# Patient Record
Sex: Female | Born: 1985 | Race: White | Hispanic: No | Marital: Single | Smoking: Former smoker
Health system: Southern US, Community
[De-identification: ages and names within clinical notes are randomized; demographics above are authoritative.]

## PROBLEM LIST (undated history)

## (undated) DIAGNOSIS — F1921 Other psychoactive substance dependence, in remission: Secondary | ICD-10-CM

## (undated) DIAGNOSIS — F419 Anxiety disorder, unspecified: Secondary | ICD-10-CM

## (undated) DIAGNOSIS — Z87442 Personal history of urinary calculi: Secondary | ICD-10-CM

## (undated) DIAGNOSIS — F1120 Opioid dependence, uncomplicated: Secondary | ICD-10-CM

## (undated) DIAGNOSIS — U071 COVID-19: Secondary | ICD-10-CM

## (undated) HISTORY — DX: Personal history of urinary calculi: Z87.442

## (undated) HISTORY — PX: NO PAST SURGERIES: SHX2092

## (undated) HISTORY — DX: COVID-19: U07.1

## (undated) HISTORY — DX: Other psychoactive substance dependence, in remission: F19.21

## (undated) HISTORY — DX: Anxiety disorder, unspecified: F41.9

## (undated) HISTORY — DX: Opioid dependence, uncomplicated: F11.20

---
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
## 2007-07-29 ENCOUNTER — Ambulatory Visit: Payer: Self-pay | Admitting: Internal Medicine

## 2007-07-29 DIAGNOSIS — J45901 Unspecified asthma with (acute) exacerbation: Secondary | ICD-10-CM

## 2007-07-29 DIAGNOSIS — J309 Allergic rhinitis, unspecified: Secondary | ICD-10-CM | POA: Insufficient documentation

## 2007-07-29 DIAGNOSIS — F411 Generalized anxiety disorder: Secondary | ICD-10-CM | POA: Insufficient documentation

## 2007-07-29 HISTORY — DX: Unspecified asthma with (acute) exacerbation: J45.901

## 2007-08-29 ENCOUNTER — Ambulatory Visit: Payer: Self-pay | Admitting: Internal Medicine

## 2007-10-02 ENCOUNTER — Emergency Department (HOSPITAL_COMMUNITY): Admission: EM | Admit: 2007-10-02 | Discharge: 2007-10-02 | Payer: Self-pay | Admitting: Emergency Medicine

## 2007-10-04 ENCOUNTER — Telehealth: Payer: Self-pay | Admitting: Internal Medicine

## 2008-01-29 ENCOUNTER — Ambulatory Visit: Payer: Self-pay | Admitting: Internal Medicine

## 2008-01-30 ENCOUNTER — Ambulatory Visit: Payer: Self-pay | Admitting: Family Medicine

## 2008-01-30 ENCOUNTER — Telehealth: Payer: Self-pay | Admitting: Internal Medicine

## 2008-02-03 ENCOUNTER — Telehealth: Payer: Self-pay | Admitting: Internal Medicine

## 2008-10-14 ENCOUNTER — Ambulatory Visit: Payer: Self-pay | Admitting: Internal Medicine

## 2008-10-14 DIAGNOSIS — Z87442 Personal history of urinary calculi: Secondary | ICD-10-CM

## 2008-10-14 HISTORY — DX: Personal history of urinary calculi: Z87.442

## 2009-03-30 ENCOUNTER — Ambulatory Visit: Payer: Self-pay | Admitting: Internal Medicine

## 2009-03-30 DIAGNOSIS — J069 Acute upper respiratory infection, unspecified: Secondary | ICD-10-CM | POA: Insufficient documentation

## 2009-04-28 ENCOUNTER — Ambulatory Visit: Payer: Self-pay | Admitting: Family Medicine

## 2009-04-28 DIAGNOSIS — J029 Acute pharyngitis, unspecified: Secondary | ICD-10-CM | POA: Insufficient documentation

## 2009-04-28 DIAGNOSIS — J209 Acute bronchitis, unspecified: Secondary | ICD-10-CM

## 2010-09-16 NOTE — Discharge Summary (Signed)
Brandy Zuniga, Brandy Zuniga NO.:  [id number]   MEDICAL RECORD NO.:  [id number]          PATIENT TYPE:  INP   LOCATION:  6126                         FACILITY:  [facility]   PHYSICIAN:  Sylvan Cheese, M.D.       DATE OF BIRTH:  25-Feb-1986   DATE OF ADMISSION:  06/01/2006  DATE OF DISCHARGE:  06/03/2006                               DISCHARGE SUMMARY   DISCHARGE DIAGNOSES:  1. Acute asthma exacerbation.  2. Small right apical pneumothorax and pneumomediastinum.   DISCHARGE MEDICATIONS:  1. Albuterol 2.5 mg nebulizers every six hours as needed for wheezing.  2. Prednisone 60 mg p.o. daily for three days.   HOSPITAL COURSE:  [name] is an 25 year old white female who presented to  her primary physician on the day of admission with a one-day history of  wheezing that had been responsive to one day of albuterol nebulizers and  prednisone 60 mg p.o.  She received two back-to-back albuterol  treatments in her primary physician's office and a third nebulizer  treatment via EMS en route to the hospital.  Upon arrival to the  emergency department, her oxygen saturation was 79% on room air.  It  improved to 100% on nonrebreather mask.  She completed a one hour  continuous albuterol and Atrovent nebulizer and also received Solu-  Medrol 125 mg IV in the emergency department.  She showed significant  improvement in her work of breathing and tachypnea.  The patient had a  rapid strep test that was negative on admission.  She was started on  albuterol nebulizers every four hours with every two hour as needed  treatment.  These nebulizers were spaced every six hours at the time of  discharge.  She was also initiated on oxygen therapy to maintain O2  saturations greater than 93%.  She was tolerating room air for more than  24 hours at the time of discharge.  Of note the patient did have a chest  x-ray on admission that showed a small right apical pneumothorax with  subcutaneous air in the  right axilla and chest wall.  A repeat chest x-  ray on February 2 showed worsening subcutaneous air.  A final chest x-  ray on February 3 showed stable pneumomediastinum with some improvement.  There was also questionable left lower lobe atelectasis.  She reports  much improved breathing status and a cough that is almost resolved.   FOLLOWUP INSTRUCTIONS:  The patient has been instructed to follow up  with her primary physician at [facility].  She was instructed  to call to make an appointment this week.           ______________________________  Sylvan Cheese, M.D.     MJ/MEDQ  D:  06/03/2006  T:  06/03/2006  Job:  [id number]

## 2011-01-26 LAB — URINALYSIS, ROUTINE W REFLEX MICROSCOPIC
Ketones, ur: NEGATIVE
Protein, ur: 30 — AB
Urobilinogen, UA: 0.2
pH: 6

## 2011-01-26 LAB — URINE MICROSCOPIC-ADD ON

## 2011-01-26 LAB — CBC
HCT: 43.4
MCHC: 34.5
RBC: 4.66
RDW: 13.5
WBC: 9.1

## 2011-01-26 LAB — URINE CULTURE: Colony Count: 7000

## 2011-01-26 LAB — POCT I-STAT, CHEM 8
Chloride: 105
Glucose, Bld: 92
HCT: 45
Potassium: 3.5

## 2011-01-26 LAB — DIFFERENTIAL
Eosinophils Relative: 4
Lymphocytes Relative: 31
Monocytes Absolute: 0.8

## 2011-02-01 ENCOUNTER — Emergency Department (HOSPITAL_COMMUNITY): Payer: Worker's Compensation

## 2011-02-01 ENCOUNTER — Inpatient Hospital Stay (HOSPITAL_COMMUNITY)
Admission: EM | Admit: 2011-02-01 | Discharge: 2011-02-04 | DRG: 505 | Disposition: A | Discharge status: Home / Self Care | Payer: Worker's Compensation | Source: Ambulatory Visit | Attending: General Surgery | Admitting: General Surgery

## 2011-02-01 DIAGNOSIS — IMO0002 Reserved for concepts with insufficient information to code with codable children: Secondary | ICD-10-CM

## 2011-02-01 DIAGNOSIS — F17200 Nicotine dependence, unspecified, uncomplicated: Secondary | ICD-10-CM | POA: Diagnosis present

## 2011-02-01 DIAGNOSIS — Y992 Volunteer activity: Secondary | ICD-10-CM

## 2011-02-01 DIAGNOSIS — S62309A Unspecified fracture of unspecified metacarpal bone, initial encounter for closed fracture: Secondary | ICD-10-CM | POA: Diagnosis present

## 2011-02-01 DIAGNOSIS — S92109A Unspecified fracture of unspecified talus, initial encounter for closed fracture: Secondary | ICD-10-CM

## 2011-02-01 DIAGNOSIS — Y92410 Unspecified street and highway as the place of occurrence of the external cause: Secondary | ICD-10-CM

## 2011-02-01 DIAGNOSIS — F3289 Other specified depressive episodes: Secondary | ICD-10-CM | POA: Diagnosis present

## 2011-02-01 DIAGNOSIS — J45909 Unspecified asthma, uncomplicated: Secondary | ICD-10-CM | POA: Diagnosis present

## 2011-02-01 DIAGNOSIS — F329 Major depressive disorder, single episode, unspecified: Secondary | ICD-10-CM | POA: Diagnosis present

## 2011-02-01 DIAGNOSIS — F1120 Opioid dependence, uncomplicated: Secondary | ICD-10-CM | POA: Diagnosis present

## 2011-02-01 LAB — COMPREHENSIVE METABOLIC PANEL
ALT: 16 U/L (ref 0–35)
AST: 26 U/L (ref 0–37)
Alkaline Phosphatase: 66 U/L (ref 39–117)
CO2: 23 mEq/L (ref 19–32)
Chloride: 105 mEq/L (ref 96–112)
GFR calc non Af Amer: >90 mL/min (ref >90)
Sodium: 137 mEq/L (ref 135–145)
Total Bilirubin: 0.4 mg/dL (ref 0.3–1.2)

## 2011-02-01 LAB — POCT I-STAT, CHEM 8
Calcium, Ion: 1.09 mmol/L — ABNORMAL LOW (ref 1.12–1.32)
Creatinine, Ser: 0.7 mg/dL (ref 0.50–1.10)
Glucose, Bld: 109 mg/dL — ABNORMAL HIGH (ref 70–99)
HCT: 38 % (ref 36.0–46.0)
Hemoglobin: 12.9 g/dL (ref 12.0–15.0)
Potassium: 3.2 meq/L — ABNORMAL LOW (ref 3.5–5.1)
TCO2: 21 mmol/L (ref 0–100)

## 2011-02-01 LAB — CBC
HCT: 34.9 % — ABNORMAL LOW (ref 36.0–46.0)
RBC: 3.97 MIL/uL (ref 3.87–5.11)
RDW: 12.7 % (ref 11.5–15.5)
WBC: 12.1 10*3/uL — ABNORMAL HIGH (ref 4.0–10.5)

## 2011-02-01 LAB — PROTIME-INR
INR: 1.12 (ref 0.00–1.49)
Prothrombin Time: 14.6 seconds (ref 11.6–15.2)

## 2011-02-01 LAB — TYPE AND SCREEN
ABO/RH(D): B POS
Antibody Screen: NEGATIVE

## 2011-02-01 LAB — URINALYSIS, ROUTINE W REFLEX MICROSCOPIC
Bilirubin Urine: NEGATIVE
Hgb urine dipstick: NEGATIVE
Ketones, ur: NEGATIVE mg/dL
Protein, ur: NEGATIVE mg/dL
Specific Gravity, Urine: 1.008 (ref 1.005–1.030)
Urobilinogen, UA: 0.2 mg/dL (ref 0.0–1.0)

## 2011-02-01 LAB — LACTIC ACID, PLASMA: Lactic Acid, Venous: 1.6 mmol/L (ref 0.5–2.2)

## 2011-02-02 ENCOUNTER — Inpatient Hospital Stay (HOSPITAL_COMMUNITY): Payer: Worker's Compensation

## 2011-02-02 LAB — BASIC METABOLIC PANEL
BUN: 5 mg/dL — ABNORMAL LOW (ref 6–23)
Chloride: 104 mEq/L (ref 96–112)
Creatinine, Ser: 0.61 mg/dL (ref 0.50–1.10)
GFR calc Af Amer: >90 mL/min (ref >90)

## 2011-02-02 LAB — CBC
HCT: 36.9 % (ref 36.0–46.0)
MCV: 88.9 fL (ref 78.0–100.0)
RDW: 12.9 % (ref 11.5–15.5)
WBC: 12.8 10*3/uL — ABNORMAL HIGH (ref 4.0–10.5)

## 2011-02-13 ENCOUNTER — Encounter (HOSPITAL_COMMUNITY)
Admission: RE | Admit: 2011-02-13 | Discharge: 2011-02-13 | Disposition: A | Discharge status: Home / Self Care | Payer: Worker's Compensation | Source: Ambulatory Visit | Attending: Orthopedic Surgery | Admitting: Orthopedic Surgery

## 2011-02-13 LAB — SURGICAL PCR SCREEN: MRSA, PCR: NEGATIVE

## 2011-02-13 LAB — CBC
HCT: 37.7 % (ref 36.0–46.0)
MCHC: 35.5 g/dL (ref 30.0–36.0)
MCV: 87.9 fL (ref 78.0–100.0)
RDW: 12.3 % (ref 11.5–15.5)

## 2011-02-14 ENCOUNTER — Inpatient Hospital Stay (HOSPITAL_COMMUNITY)
Admission: RE | Admit: 2011-02-14 | Discharge: 2011-02-15 | DRG: 497 | Disposition: A | Discharge status: Home / Self Care | Payer: Worker's Compensation | Source: Ambulatory Visit | Attending: Orthopedic Surgery | Admitting: Orthopedic Surgery

## 2011-02-14 ENCOUNTER — Inpatient Hospital Stay (HOSPITAL_COMMUNITY): Payer: Worker's Compensation

## 2011-02-14 DIAGNOSIS — S92109A Unspecified fracture of unspecified talus, initial encounter for closed fracture: Principal | ICD-10-CM | POA: Diagnosis present

## 2011-02-14 DIAGNOSIS — L089 Local infection of the skin and subcutaneous tissue, unspecified: Secondary | ICD-10-CM | POA: Diagnosis present

## 2011-02-14 DIAGNOSIS — J45909 Unspecified asthma, uncomplicated: Secondary | ICD-10-CM | POA: Diagnosis present

## 2011-02-14 DIAGNOSIS — Z01812 Encounter for preprocedural laboratory examination: Secondary | ICD-10-CM

## 2011-02-14 DIAGNOSIS — Z87828 Personal history of other (healed) physical injury and trauma: Secondary | ICD-10-CM

## 2011-02-15 LAB — CBC
HCT: 34.4 % — ABNORMAL LOW (ref 36.0–46.0)
Hemoglobin: 12.1 g/dL (ref 12.0–15.0)
MCH: 30.9 pg (ref 26.0–34.0)
MCHC: 35.2 g/dL (ref 30.0–36.0)
MCV: 88 fL (ref 78.0–100.0)

## 2011-02-15 LAB — BASIC METABOLIC PANEL
CO2: 25 mEq/L (ref 19–32)
Calcium: 9.5 mg/dL (ref 8.4–10.5)
Chloride: 101 mEq/L (ref 96–112)
Creatinine, Ser: 0.47 mg/dL — ABNORMAL LOW (ref 0.50–1.10)
GFR calc Af Amer: >90 mL/min (ref >90)
Sodium: 137 mEq/L (ref 135–145)
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
## 2011-02-23 NOTE — Op Note (Signed)
NAMEPERLE, BRICKHOUSE NO.:  [id number]  MEDICAL RECORD NO.:  [id number]  LOCATION:  5011                         FACILITY:  [facility]  PHYSICIAN:  Doralee Albino. Carola Frost, M.D. DATE OF BIRTH:  11-Oct-1985  DATE OF PROCEDURE:  02/14/2011 DATE OF DISCHARGE:                              OPERATIVE REPORT   PREOPERATIVE DIAGNOSES: 1. Right talar neck and body fractures. 2. Retained external fixator. 3. External fixator pin site infection proximal tibia.  POSTOPERATIVE DIAGNOSES: 1. Right talar neck and body fractures. 2. Retained external fixator. 3. External fixator pin site infection proximal tibia.  PROCEDURES: 1. Open reduction and internal fixation of right talus. 2. Removal of external fixator under anesthesia. 3. Incision and drainage of the pin sites proximal tibia with     curettage.  SURGEON:  Doralee Albino. Carola Frost, MD  ASSISTANT:  Mearl Latin, PA  ANESTHESIA:  General supplemented with popliteal block.  COMPLICATIONS:  None.  ESTIMATED BLOOD LOSS:  Less than 50 mL.  TOTAL TOURNIQUET TIME:  Approximately 100 minutes.  DISPOSITION:  To PACU.  CONDITION:  Stable.  BRIEF SUMMARY AND INDICATIONS OF PROCEDURE:  Brandy Zuniga is a 25 year old female status post fracture dislocation of the right talus.  This was treated provisionally with spanning external fixation, partial reduction, and soft tissue measures to control and reduce swelling. Once the soft tissues were adequately resolved, she was then able to return to the OR for definitive internal fixation.  I discussed with her the risks and benefits of surgery.  A discussion which was also had with her parents.  These included avascular necrosis, arthritis, malunion, nonunion, symptomatic hardware, need for further surgery, decreased motion, DVT, PE, heart attack, stroke, and multiple others and the patient did wish to proceed.  BRIEF SUMMARY OF PROCEDURE:  Brandy Zuniga was administered  preoperative antibiotics as well as a popliteal block and taken to the operating room where general anesthesia was induced.  Her right lower extremity was prepped and draped in usual sterile fashion.  Tourniquet was placed about the thigh.  The external fixator was then removed.  The fixator pin sites scrubbed thoroughly with chlorhexidine.  I did remove them from the medial side given the proximal medial tibia was the most painful for the patient and she had some surrounding erythema and drainage with tenderness that had continued to expand into her pes bursa.  A curette was then used to thoroughly clean the bone canal and to excise the bone tract, as well as to work within the soft tissues and bursal area medially and laterally.  Following this, a sterile irrigation was performed and then the pin sites were isolated from the area with Ioban and lap sponges.  The drape was removed and fresh drapes used for the remainder of the case. Anterolateral approach was then made to the talar neck elevating the extensor digitorum brevis and elevating the tourniquet after exposure first exsanguinating the limb with an Esmarch bandage.  The talar neck fracture was developed and the joint evaluated.  There was considerable comminution to the bone adjacent to the lateral talar process.  There was also a large fragment in the posterior aspect of  the joint, which could not be retrieved through the fracture site.  Ultimately, this fragment had to be approached through a separate posterolateral arthrotomy which increased the difficulty of the procedure considerably and added onto the operative time substantially as well by at least 25 minutes.  The fracture fragments were then reduced placing an instrument posterior to the talus and bring it forward into a reduced position along the lateral gutter inside placing a K-wire into the talar head so it could be used as a joystick for reduction and achieving  provisional reduction with K-wires, then, there was again multiple comminuted fragments that were osteochondral that were debrided from the joint, part of the subtalar joint was not reconstructible.  The portion of the posterior facet taken out of the back of the joint had extensively damaged cartilage on it and it was nearly a 2 x 1 cm but had no substantial bone behind this large osteochondral fragments such that it could be fixed.  The defect was grafted with flexor graft and the graft was tamped away from the articular surface to create more room for any sort of injury to the receiving articular side.  The graft was placed after first applying the small 2.0 blade plate along the lateral aspect of the talar head, neck and body.  This resulted in excellent reduction stabilization of the fracture.  It was checked on multiple C-arm images including AP, oblique, and lateral views to ensure appropriate reduction hardware placement.  There remained some loss of articular surface posteriorly that appeared to be somewhat matching on both the calcaneal and talar side.  The wound was copiously irrigated.  The tourniquet deflated.  No significant bleeding was encountered, and then the wound was closed with 3-0 nylon and for the deep layer 3-0 Vicryl.  Sterile gently compressive dressing and a posterior stirrup splint were then applied.  The patient awakened from anesthesia and transported to PACU in stable condition.  Montez Morita, PA-C assisted me throughout the procedure and was absolutely necessary for safe and effective completion of the case particularly for exposure of the posterolateral capsule, control and maintenance of reduction during provisional fixation as well as removal of provisional fixation during definitive and assistance with wound closure among others.  PROGNOSIS:  Brandy Zuniga has had a tremendous and severe injury to her right ankle and is at elevated risk for avascular  necrosis, nonunion, subtalar arthritis given the intraoperative findings as well as the nature of the injury and therefore the potential need for subsequent surgery.  She will be nonweightbearing with unrestricted range of motion after her followup visit, and we anticipate 6-8 weeks before initiating graduated weightbearing.  The debridement of the pin site should result in resolution of her symptoms, and she will receive antibiotics accordingly.     Doralee Albino. Carola Frost, M.D.     MHH/MEDQ  D:  02/14/2011  T:  02/15/2011  Job:  [id number]  Electronically Signed by Myrene Galas M.D. on 02/23/2011 05:20:08 PM

## 2011-05-18 ENCOUNTER — Encounter: Payer: Self-pay | Admitting: Internal Medicine

## 2011-05-18 ENCOUNTER — Ambulatory Visit (INDEPENDENT_AMBULATORY_CARE_PROVIDER_SITE_OTHER): Payer: Managed Care, Other (non HMO) | Admitting: Family Medicine

## 2011-05-18 ENCOUNTER — Encounter: Payer: Worker's Compensation | Admitting: Internal Medicine

## 2011-05-18 ENCOUNTER — Encounter: Payer: Self-pay | Admitting: Family Medicine

## 2011-05-18 VITALS — BP 104/70 | Temp 98.4°F | Ht 64.0 in | Wt 131.0 lb

## 2011-05-18 DIAGNOSIS — J069 Acute upper respiratory infection, unspecified: Secondary | ICD-10-CM

## 2011-05-18 DIAGNOSIS — J029 Acute pharyngitis, unspecified: Secondary | ICD-10-CM

## 2011-05-18 LAB — POCT RAPID STREP A (OFFICE): Rapid Strep A Screen: NEGATIVE

## 2011-05-18 MED ORDER — HYDROCODONE-HOMATROPINE 5-1.5 MG/5ML PO SYRP: ORAL_SOLUTION | ORAL | Status: DC

## 2011-05-18 NOTE — Progress Notes (Signed)
  Subjective:    Patient ID: Brandy Zuniga, female    DOB: 1985-09-30, 26 y.o.   MRN: [id number]  HPI Brandy Zuniga Is a 26 year old single female, who comes in with a 3-day history of sore throat, head congestion, and mild cough.  Temp 102.  Last night, normal today.  Rapid strep negative.  She smokes a half a pack of cigarettes a day .  She states she stopped before using the cold [place] method.  Her boyfriend, who accompanies her.  Would like to also stop smoking.  I advised them to consult with Dr. Kirtland Zuniga  and consider the chantix program   Review of Systems    General in ENT review of systems otherwise negative Objective:   Physical Exam Well-developed well-nourished, female, in no acute distress.  HEENT negativeExcept for swollen tonsils rapid strep negative.  Neck was supple.  No adenopathy.  Lungs are clear       Assessment & Plan:  Viral syndrome.  Plan treat symptomatically, liquids, Hydromet p.r.n.  Tobacco abuse, stopped now

## 2011-05-18 NOTE — Patient Instructions (Signed)
Drink lots of liquids.  Hydromet one half to 1 teaspoon up to 3 times a day as needed for sore throat.  Stop smoking.  Return p.r.n.

## 2011-06-29 NOTE — Progress Notes (Signed)
This encounter was created in error - please disregard.

## 2011-09-27 ENCOUNTER — Other Ambulatory Visit: Payer: Self-pay | Admitting: Orthopedic Surgery

## 2011-09-27 DIAGNOSIS — M1990 Unspecified osteoarthritis, unspecified site: Secondary | ICD-10-CM

## 2011-10-02 ENCOUNTER — Ambulatory Visit
Admission: RE | Admit: 2011-10-02 | Discharge: 2011-10-02 | Disposition: A | Discharge status: Home / Self Care | Payer: Worker's Compensation | Source: Ambulatory Visit | Attending: Orthopedic Surgery | Admitting: Orthopedic Surgery

## 2011-10-02 DIAGNOSIS — M1990 Unspecified osteoarthritis, unspecified site: Secondary | ICD-10-CM

## 2011-10-02 MED ORDER — METHYLPREDNISOLONE ACETATE 40 MG/ML INJ SUSP (RADIOLOG
120.0000 mg | Freq: Once | INTRAMUSCULAR | Status: AC
  Administered 2011-10-02: 120 mg via INTRA_ARTICULAR

## 2011-10-02 MED ORDER — IOHEXOL 180 MG/ML  SOLN
1.0000 mL | Freq: Once | INTRAMUSCULAR | Status: AC | PRN
  Administered 2011-10-02: 1 mL via INTRA_ARTICULAR

## 2012-01-18 ENCOUNTER — Other Ambulatory Visit: Payer: Self-pay | Admitting: Orthopedic Surgery

## 2012-01-18 DIAGNOSIS — M1990 Unspecified osteoarthritis, unspecified site: Secondary | ICD-10-CM

## 2012-01-22 ENCOUNTER — Ambulatory Visit
Admission: RE | Admit: 2012-01-22 | Discharge: 2012-01-22 | Disposition: A | Discharge status: Home / Self Care | Payer: Worker's Compensation | Source: Ambulatory Visit | Attending: Orthopedic Surgery | Admitting: Orthopedic Surgery

## 2012-01-22 DIAGNOSIS — M1990 Unspecified osteoarthritis, unspecified site: Secondary | ICD-10-CM

## 2012-01-22 MED ORDER — METHYLPREDNISOLONE ACETATE 40 MG/ML INJ SUSP (RADIOLOG
120.0000 mg | Freq: Once | INTRAMUSCULAR | Status: AC
  Administered 2012-01-22: 120 mg via INTRA_ARTICULAR

## 2012-01-22 MED ORDER — IOHEXOL 180 MG/ML  SOLN
1.0000 mL | Freq: Once | INTRAMUSCULAR | Status: AC | PRN
  Administered 2012-01-22: 1 mL via INTRA_ARTICULAR

## 2013-01-09 IMAGING — CT CT ANKLE*R* W/O CM
3 series · 15 of 33 positions shown, 18 images · non-contrast
Comparison: CT 02/01/2011 and radiographs 02/02/2011.

CLINICAL DATA: Follow-up right talar fracture status post external
fixation.

CT OF THE RIGHT ANKLE WITHOUT CONTRAST
TECHNIQUE: Multidetector CT imaging of the right ankle and right
foot was performed according to the standard protocol without
intravenous contrast. Multiplanar CT image reconstructions were
also generated.
TECHNIQUE: 3-dimensional CT images were rendered by post-
processing of the original CT data at the CT scanner.  The 3-
dimensional CT images were interpreted, and findings were reported
in the accompanying complete CT report for this study.

[Series 6: lowextremity 2.0 b20s · axial · 0.54mm/px · z∈[-1122,-900]mm · 7 of 133 slices shown, 9 images]
[im 11/133  soft-tissue]
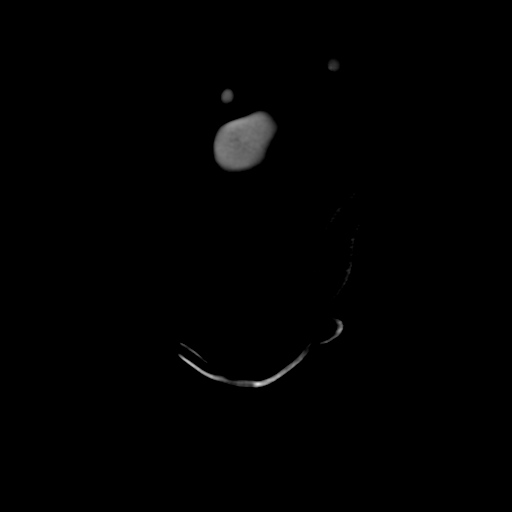
[im 11/133  bone]
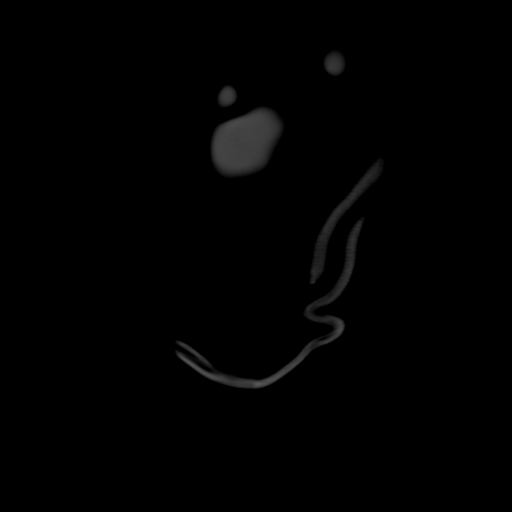
[im 31/133  bone]
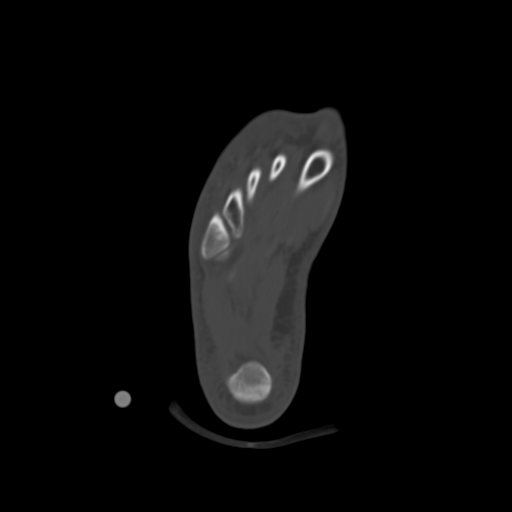
[im 51/133  bone]
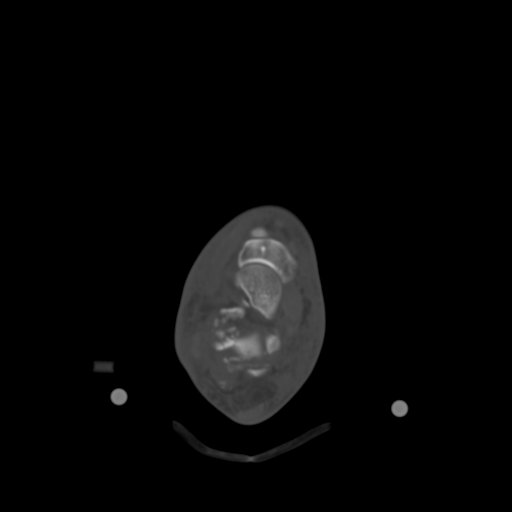
[im 72/133  bone]
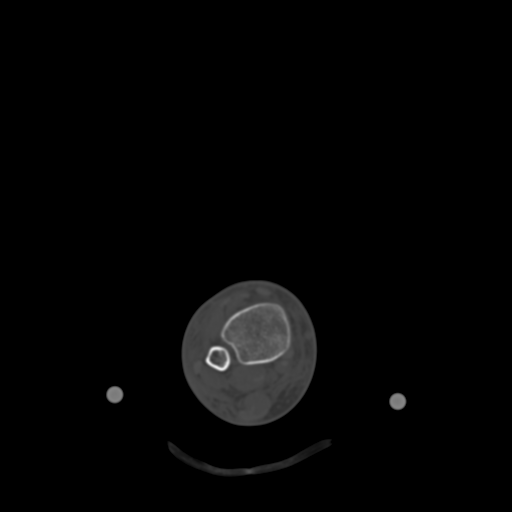
[im 82/133  soft-tissue]
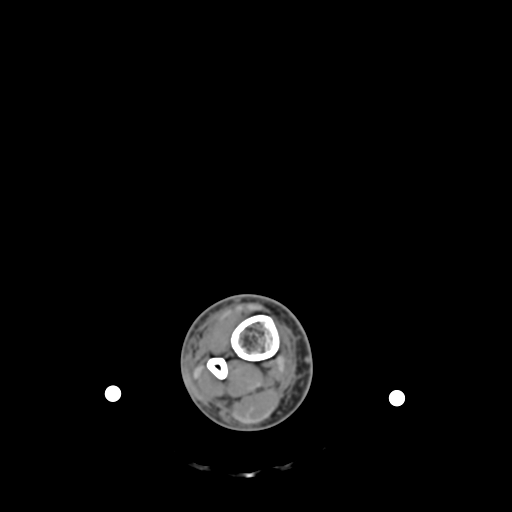
[im 82/133  bone]
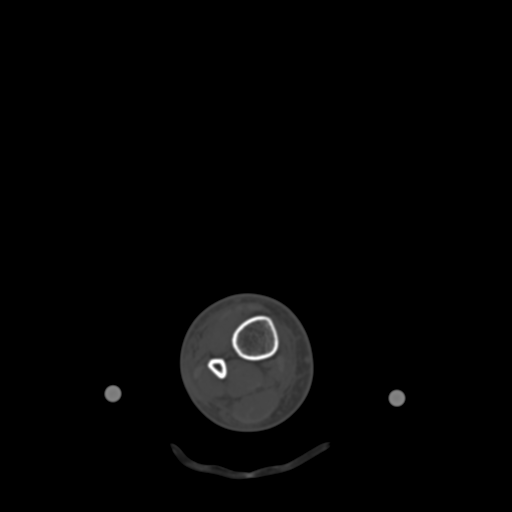
[im 102/133  bone]
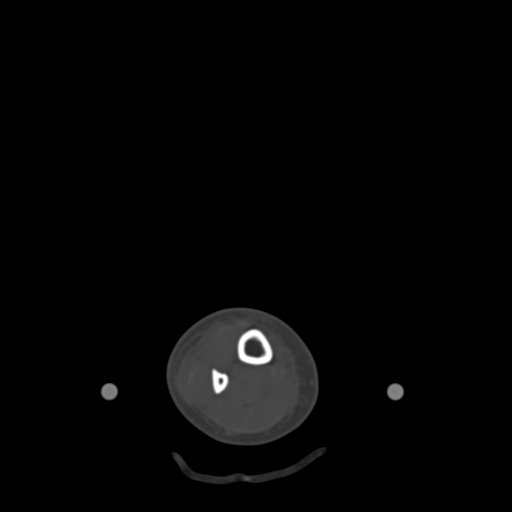
[im 122/133  bone]
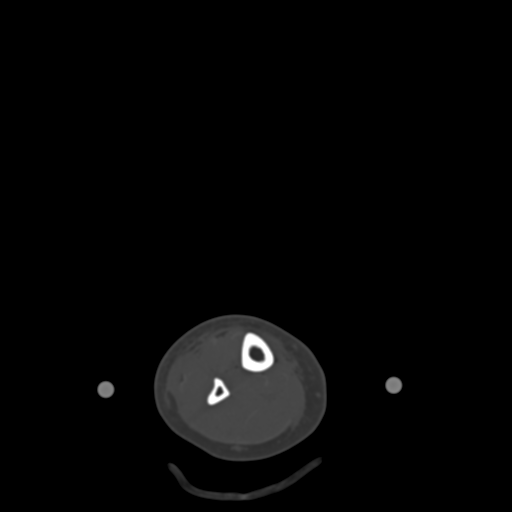

[Series 602: sagittal · sagittal · 0.54mm/px · 5 of 50 slices shown, 6 images]
[im 13/50  bone]
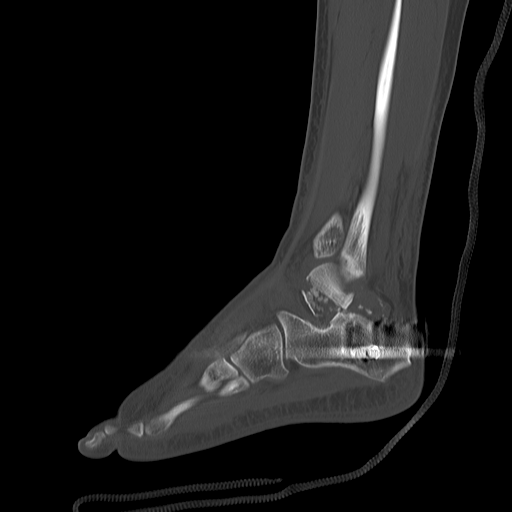
[im 17/50  bone]
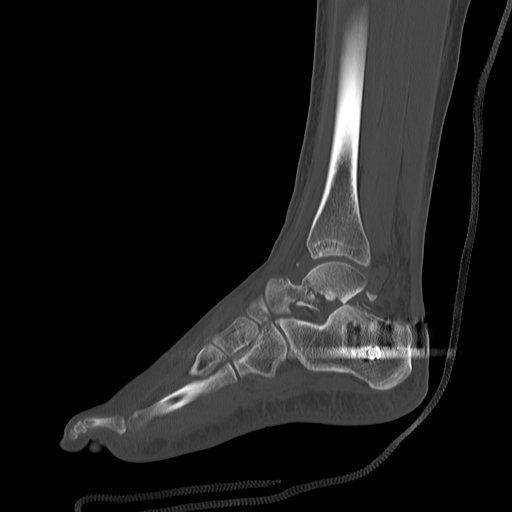
[im 21/50  bone]
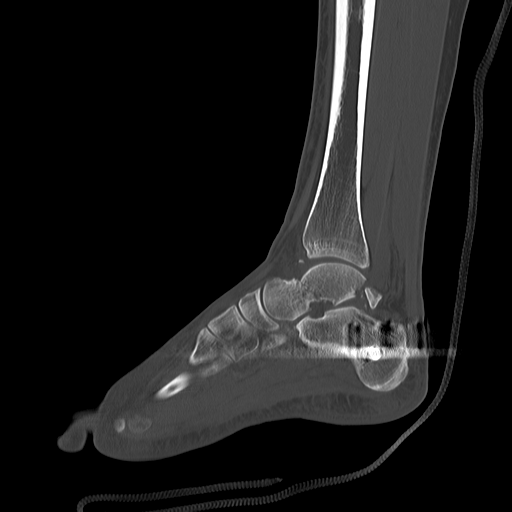
[im 25/50  soft-tissue]
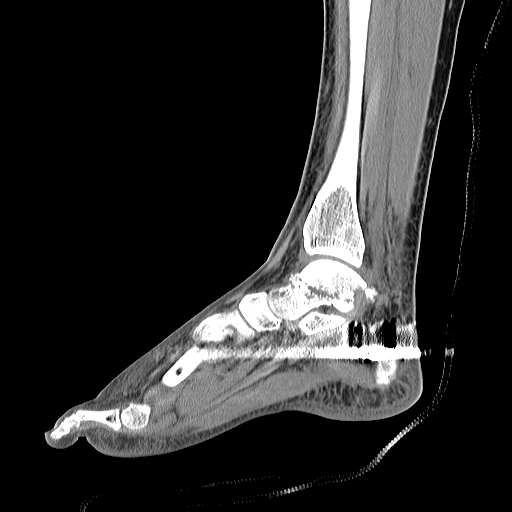
[im 25/50  bone]
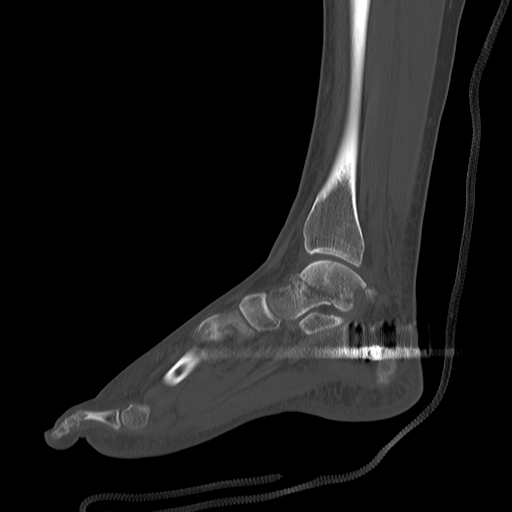
[im 29/50  bone]
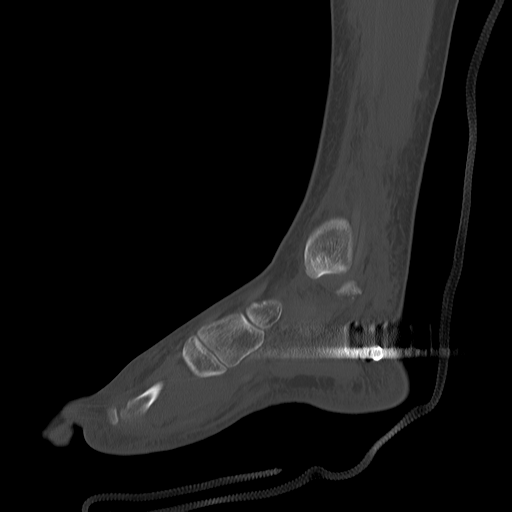

[Series 603: coronal · coronal · 0.54mm/px · 3 of 103 slices shown]
[im 21/103  bone]
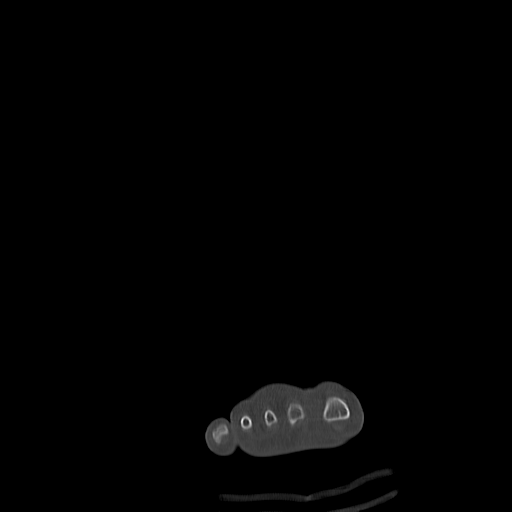
[im 41/103  bone]
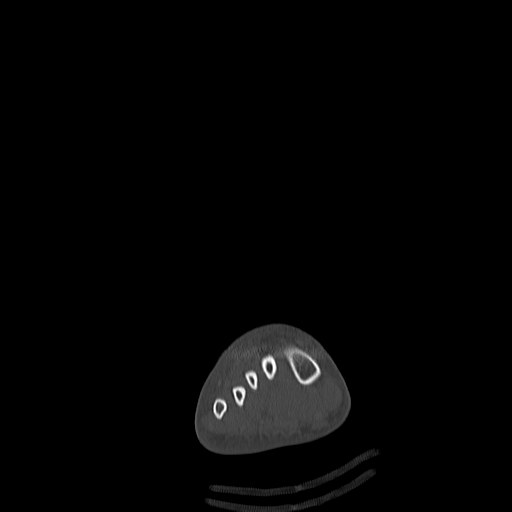
[im 62/103  bone]
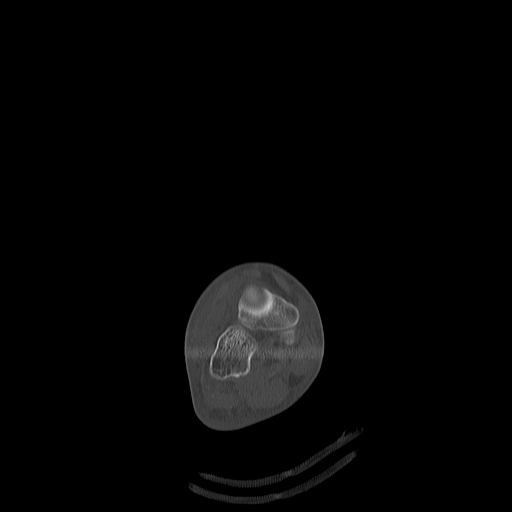

[15 of 33 positions shown; findings below may reference images not displayed]

FINDINGS: There is a new external fixation pin within the
calcaneal tuberosity.  The artifact from this hardware limits the 3-
D reconstructed images.

There is improved distraction of the extensively comminuted
fracture of the talar neck.  There has been partial restoration of
the posterior facet of the subtalar joint.  However, there is still
approximately 1.4 cm of posterior subluxation of the talus with
respect to the posterior facet of the calcaneus.  Large posterior
fracture fragment arising from the trigonal process of the talus
appears unchanged.  The component of fracture extending to the
talar dome appears unchanged and minimally displaced.

There is still mild anterior widening of the tibiotalar joint.  The
previously demonstrated fracture fragment interposed between the
talar dome and tibial plafond is no longer seen.  There are
multiple small fracture fragments within the joint.  Again, there
are small fracture fragments adjacent to the lateral malleolus
which are probably related to the talar fracture, although could
reflect small avulsion fractures from the distal fibula.  The
tibial plafond and medial malleolus appear intact.
IMPRESSION: Improved distraction of the extensively comminuted fracture of the
talar neck status post external fixation.  There is improved
posterior dislocation of the talar body with respect to the
posterior facet of the subtalar joint.

CT OF THE RIGHT FOOT WITHOUT CONTRAST
FINDINGS: No acute findings are identified within the midfoot or
forefoot.  Irregularity of the anterior process of the calcaneus is
unchanged.  This does not appear acute and is attributed to an
accessory ossicle.
IMPRESSION: No acute new findings in the midfoot or forefoot.

3-DIMENSIONAL CT IMAGE RENDERING AT CT SCANNER:

## 2013-02-28 ENCOUNTER — Encounter: Payer: Self-pay | Admitting: Internal Medicine

## 2013-02-28 ENCOUNTER — Telehealth: Payer: Self-pay | Admitting: Internal Medicine

## 2013-02-28 ENCOUNTER — Ambulatory Visit (INDEPENDENT_AMBULATORY_CARE_PROVIDER_SITE_OTHER): Payer: Self-pay | Admitting: Internal Medicine

## 2013-02-28 VITALS — BP 136/90 | HR 77 | Temp 98.2°F | Wt 146.0 lb

## 2013-02-28 DIAGNOSIS — F1921 Other psychoactive substance dependence, in remission: Secondary | ICD-10-CM

## 2013-02-28 DIAGNOSIS — F4310 Post-traumatic stress disorder, unspecified: Secondary | ICD-10-CM | POA: Insufficient documentation

## 2013-02-28 DIAGNOSIS — N926 Irregular menstruation, unspecified: Secondary | ICD-10-CM

## 2013-02-28 DIAGNOSIS — R110 Nausea: Secondary | ICD-10-CM

## 2013-02-28 DIAGNOSIS — F411 Generalized anxiety disorder: Secondary | ICD-10-CM

## 2013-02-28 DIAGNOSIS — F1120 Opioid dependence, uncomplicated: Secondary | ICD-10-CM

## 2013-02-28 HISTORY — DX: Post-traumatic stress disorder, unspecified: F43.10

## 2013-02-28 HISTORY — DX: Other psychoactive substance dependence, in remission: F19.21

## 2013-02-28 HISTORY — DX: Opioid dependence, uncomplicated: F11.20

## 2013-02-28 LAB — CBC WITH DIFFERENTIAL/PLATELET
Basophils Relative: 0.3 % (ref 0.0–3.0)
Eosinophils Relative: 2.7 % (ref 0.0–5.0)
Hemoglobin: 15.2 g/dL — ABNORMAL HIGH (ref 12.0–15.0)
Lymphocytes Relative: 31.2 % (ref 12.0–46.0)
MCHC: 34 g/dL (ref 30.0–36.0)
Neutro Abs: 5 10*3/uL (ref 1.4–7.7)
Neutrophils Relative %: 57.7 % (ref 43.0–77.0)
RBC: 4.75 Mil/uL (ref 3.87–5.11)
WBC: 8.7 10*3/uL (ref 4.5–10.5)

## 2013-02-28 LAB — POCT URINALYSIS DIP (MANUAL ENTRY)
Bilirubin, UA: NEGATIVE
Glucose, UA: NEGATIVE
Ketones, POC UA: NEGATIVE
Nitrite, UA: NEGATIVE
Protein Ur, POC: NEGATIVE
Spec Grav, UA: 1.015
Urobilinogen, UA: 0.2
pH, UA: 7

## 2013-02-28 LAB — BASIC METABOLIC PANEL
CO2: 26 mEq/L (ref 19–32)
Calcium: 9.4 mg/dL (ref 8.4–10.5)
Chloride: 102 mEq/L (ref 96–112)
Creatinine, Ser: 0.7 mg/dL (ref 0.4–1.2)
Sodium: 136 mEq/L (ref 135–145)

## 2013-02-28 LAB — HEPATIC FUNCTION PANEL
ALT: 18 U/L (ref 0–35)
AST: 22 U/L (ref 0–37)
Albumin: 4.7 g/dL (ref 3.5–5.2)
Alkaline Phosphatase: 57 U/L (ref 39–117)
Bilirubin, Direct: 0 mg/dL (ref 0.0–0.3)
Total Bilirubin: 0.9 mg/dL (ref 0.3–1.2)
Total Protein: 7.9 g/dL (ref 6.0–8.3)

## 2013-02-28 LAB — LIPASE: Lipase: 32 U/L (ref 11.0–59.0)

## 2013-02-28 MED ORDER — ONDANSETRON 4 MG PO TBDP: 4.0000 mg | ORAL_TABLET | Freq: Three times a day (TID) | ORAL | Status: DC | PRN

## 2013-02-28 NOTE — Patient Instructions (Signed)
Will notify you  of labs when available. Med as needed for nausea in the meantime. ROV  With Dr Kirtland Bouchard in 2  Weeks.  Minimize advil aleve tylenol asa alcohol in the short run.  If not done can take otc omeprezole  Once a day  For gastritis  Also for 2 weeks
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
## 2013-03-06 ENCOUNTER — Ambulatory Visit (INDEPENDENT_AMBULATORY_CARE_PROVIDER_SITE_OTHER): Payer: Worker's Compensation | Admitting: Internal Medicine

## 2013-03-06 ENCOUNTER — Encounter: Payer: Self-pay | Admitting: Internal Medicine

## 2013-03-06 VITALS — BP 120/80 | HR 81 | Temp 98.0°F | Resp 20 | Wt 147.8 lb

## 2013-03-06 DIAGNOSIS — R110 Nausea: Secondary | ICD-10-CM

## 2013-03-06 DIAGNOSIS — F411 Generalized anxiety disorder: Secondary | ICD-10-CM

## 2013-03-06 MED ORDER — SERTRALINE HCL 50 MG PO TABS: 50.0000 mg | ORAL_TABLET | Freq: Every day | ORAL | Status: DC

## 2013-03-06 MED ORDER — LORAZEPAM 0.5 MG PO TABS: 0.5000 mg | ORAL_TABLET | Freq: Two times a day (BID) | ORAL | Status: DC | PRN

## 2013-03-06 NOTE — Patient Instructions (Addendum)
It is important that you exercise regularly, at least 20 minutes 3 to 4 times per week.  If you develop chest pain or shortness of breath seek  medical attention.  Return in one month for follow-up  Consider behavioral health referral
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
## 2013-03-13 ENCOUNTER — Ambulatory Visit: Payer: Self-pay | Admitting: Internal Medicine

## 2013-03-14 ENCOUNTER — Ambulatory Visit: Payer: Self-pay | Admitting: Internal Medicine

## 2013-03-25 ENCOUNTER — Telehealth: Payer: Self-pay | Admitting: Internal Medicine

## 2013-03-25 MED ORDER — LORAZEPAM 0.5 MG PO TABS: 0.5000 mg | ORAL_TABLET | Freq: Four times a day (QID) | ORAL | Status: DC | PRN

## 2013-03-25 NOTE — Telephone Encounter (Signed)
Spoke to pt told her okay to increase Lorazepam to every 6 hours as needed will call in new Rx for her. Also told pt will reassess at follow up appt and Dr.K suggested Behavioral Health. Asked pt if she has the info for [facility]. Pt stated yes and that she is suppose to follow up in one month with Dr. Kirtland Bouchard. Asked pt if she made appt yet? Pt stated no. Told pt need to call back in the morning and schedule first week in Dec. Pt verbalized understanding.

## 2013-03-25 NOTE — Telephone Encounter (Signed)
Please see message and advise 

## 2013-03-25 NOTE — Telephone Encounter (Signed)
Patient Information:  Caller Name: Brandy Zuniga  Phone: [phone]  Patient: Brandy Zuniga, Brandy Zuniga  Gender: Female  DOB: [birth year]  Age: 27 Years  PCP: Eleonore Chiquito (Family Practice > 32yrs old)  Office Follow Up:  Does the office need to follow up with this patient?: Yes  Instructions For The Office: Please f/u with pt concerning increase dosage of Lorazepam 0.5 mg. Thank you.   Symptoms  Reason For Call & Symptoms: Pt states she has been taking the Zolft 50mg  as prescribed and reports she is needing more of the Lorazepam 0.5 mg for the nausea and to help her sleep.  Pt states she feels she need increase dosage of the Lorazepam.  Reviewed Health History In EMR: Yes  Reviewed Medications In EMR: Yes  Reviewed Allergies In EMR: Yes  Reviewed Surgeries / Procedures: Yes  Date of Onset of Symptoms: 03/06/2013  Guideline(s) Used:  No Protocol Available - Sick Adult  Disposition Per Guideline:   Discuss with PCP and Callback by Nurse Today  Reason For Disposition Reached:   Nursing judgment  Advice Given:  Call Back If:  New symptoms develop  You become worse.  Patient Will Follow Care Advice:  YES

## 2013-03-25 NOTE — Telephone Encounter (Signed)
Okay to increase the lorazepam to every 6 hours as needed We'll reassess at next scheduled return office visit Suggest behavioral health referral. Patient has contact information (if not please provide)

## 2013-04-21 ENCOUNTER — Other Ambulatory Visit: Payer: Self-pay | Admitting: Internal Medicine

## 2013-05-22 ENCOUNTER — Other Ambulatory Visit: Payer: Self-pay | Admitting: Internal Medicine

## 2013-06-02 ENCOUNTER — Telehealth: Payer: Self-pay | Admitting: Internal Medicine

## 2013-06-02 NOTE — Telephone Encounter (Signed)
[facility] [address] requesting new script for sertraline (ZOLOFT) 50 MG tablet #60

## 2013-06-03 MED ORDER — SERTRALINE HCL 50 MG PO TABS: 50.0000 mg | ORAL_TABLET | Freq: Every day | ORAL | Status: DC

## 2013-06-03 NOTE — Telephone Encounter (Signed)
Rx sent 

## 2013-07-04 ENCOUNTER — Encounter (HOSPITAL_COMMUNITY): Payer: Self-pay | Admitting: Emergency Medicine

## 2013-07-04 ENCOUNTER — Emergency Department (HOSPITAL_COMMUNITY)
Admission: EM | Admit: 2013-07-04 | Discharge: 2013-07-05 | Disposition: A | Discharge status: Home / Self Care | Payer: Managed Care, Other (non HMO) | Attending: Emergency Medicine | Admitting: Emergency Medicine

## 2013-07-04 DIAGNOSIS — Z79899 Other long term (current) drug therapy: Secondary | ICD-10-CM | POA: Insufficient documentation

## 2013-07-04 DIAGNOSIS — N390 Urinary tract infection, site not specified: Secondary | ICD-10-CM | POA: Insufficient documentation

## 2013-07-04 DIAGNOSIS — Z3202 Encounter for pregnancy test, result negative: Secondary | ICD-10-CM | POA: Insufficient documentation

## 2013-07-04 DIAGNOSIS — Z87442 Personal history of urinary calculi: Secondary | ICD-10-CM | POA: Insufficient documentation

## 2013-07-04 DIAGNOSIS — F411 Generalized anxiety disorder: Secondary | ICD-10-CM | POA: Insufficient documentation

## 2013-07-04 DIAGNOSIS — J45909 Unspecified asthma, uncomplicated: Secondary | ICD-10-CM | POA: Insufficient documentation

## 2013-07-04 DIAGNOSIS — F17200 Nicotine dependence, unspecified, uncomplicated: Secondary | ICD-10-CM | POA: Insufficient documentation

## 2013-07-04 LAB — URINE MICROSCOPIC-ADD ON

## 2013-07-04 LAB — POC URINE PREG, ED: Preg Test, Ur: NEGATIVE

## 2013-07-04 LAB — URINALYSIS, ROUTINE W REFLEX MICROSCOPIC
Bilirubin Urine: NEGATIVE
Glucose, UA: NEGATIVE mg/dL
HGB URINE DIPSTICK: NEGATIVE
Ketones, ur: NEGATIVE mg/dL
Nitrite: NEGATIVE
PH: 6 (ref 5.0–8.0)
PROTEIN: NEGATIVE mg/dL
Specific Gravity, Urine: 1.034 — ABNORMAL HIGH (ref 1.005–1.030)
Urobilinogen, UA: 0.2 mg/dL (ref 0.0–1.0)

## 2013-07-04 MED ORDER — HYDROMORPHONE HCL PF 1 MG/ML IJ SOLN
1.0000 mg | Freq: Once | INTRAMUSCULAR | Status: AC
Start: 2013-07-04 — End: 2013-07-05
  Administered 2013-07-05: 1 mg via INTRAVENOUS
  Filled 2013-07-04: qty 1

## 2013-07-04 NOTE — ED Notes (Signed)
Pt c/o R flank pain onset last night, +nausea.

## 2013-07-05 ENCOUNTER — Emergency Department (HOSPITAL_COMMUNITY): Payer: Managed Care, Other (non HMO)

## 2013-07-05 LAB — CBC WITH DIFFERENTIAL/PLATELET
Basophils Absolute: 0 10*3/uL (ref 0.0–0.1)
Basophils Relative: 0 % (ref 0–1)
Eosinophils Absolute: 0.2 10*3/uL (ref 0.0–0.7)
Eosinophils Relative: 2 % (ref 0–5)
HCT: 46.1 % — ABNORMAL HIGH (ref 36.0–46.0)
Hemoglobin: 16.7 g/dL — ABNORMAL HIGH (ref 12.0–15.0)
Lymphocytes Relative: 30 % (ref 12–46)
Lymphs Abs: 4.1 10*3/uL — ABNORMAL HIGH (ref 0.7–4.0)
MCH: 32.9 pg (ref 26.0–34.0)
MCHC: 36.2 g/dL — ABNORMAL HIGH (ref 30.0–36.0)
MCV: 90.9 fL (ref 78.0–100.0)
Monocytes Absolute: 0.9 10*3/uL (ref 0.1–1.0)
Monocytes Relative: 7 % (ref 3–12)
Neutro Abs: 8.3 10*3/uL — ABNORMAL HIGH (ref 1.7–7.7)
Neutrophils Relative %: 61 % (ref 43–77)
Platelets: 318 10*3/uL (ref 150–400)
RBC: 5.07 MIL/uL (ref 3.87–5.11)
RDW: 13.1 % (ref 11.5–15.5)
WBC: 13.6 10*3/uL — ABNORMAL HIGH (ref 4.0–10.5)

## 2013-07-05 LAB — COMPREHENSIVE METABOLIC PANEL
ALT: 13 U/L (ref 0–35)
AST: 19 U/L (ref 0–37)
Albumin: 4.9 g/dL (ref 3.5–5.2)
Alkaline Phosphatase: 71 U/L (ref 39–117)
BUN: 14 mg/dL (ref 6–23)
CO2: 21 mEq/L (ref 19–32)
Calcium: 10 mg/dL (ref 8.4–10.5)
Chloride: 99 mEq/L (ref 96–112)
Creatinine, Ser: 0.75 mg/dL (ref 0.50–1.10)
GFR calc Af Amer: >90 mL/min (ref >90)
GFR calc non Af Amer: >90 mL/min (ref >90)
Glucose, Bld: 90 mg/dL (ref 70–99)
Potassium: 4 mEq/L (ref 3.7–5.3)
Sodium: 136 mEq/L — ABNORMAL LOW (ref 137–147)
Total Bilirubin: 0.4 mg/dL (ref 0.3–1.2)
Total Protein: 8.5 g/dL — ABNORMAL HIGH (ref 6.0–8.3)

## 2013-07-05 MED ORDER — HYDROCODONE-ACETAMINOPHEN 5-325 MG PO TABS: 1.0000 | ORAL_TABLET | Freq: Four times a day (QID) | ORAL | Status: DC | PRN

## 2013-07-05 MED ORDER — KETOROLAC TROMETHAMINE 15 MG/ML IJ SOLN
INTRAMUSCULAR | Status: AC
  Administered 2013-07-05: 30 mg via INTRAVENOUS
  Filled 2013-07-05: qty 2

## 2013-07-05 MED ORDER — NITROFURANTOIN MONOHYD MACRO 100 MG PO CAPS: 100.0000 mg | ORAL_CAPSULE | Freq: Two times a day (BID) | ORAL | Status: DC

## 2013-07-05 MED ORDER — KETOROLAC TROMETHAMINE 15 MG/ML IJ SOLN
30.0000 mg | Freq: Once | INTRAMUSCULAR | Status: AC
  Administered 2013-07-05: 30 mg via INTRAVENOUS

## 2013-07-05 NOTE — ED Provider Notes (Signed)
CSN: [id number]     Arrival date & time 07/04/13  2053 History   First MD Initiated Contact with Patient 07/04/13 2300     Chief Complaint  Patient presents with  . Flank Pain     (Consider location/radiation/quality/duration/timing/severity/associated sxs/prior Treatment) HPI Patient presents to the emergency department with lower back pain that started yesterday.  The patient, states she's had a history of kidney stones.  Patient, states, that she's unsure if this feels similar to her previous episode of kidney stone.  Patient advised, that she has not had any fever, chest pain, shortness of breath, vomiting, diarrhea, weakness, dizziness abdominal pain, vaginal bleeding, vaginal discharge or syncope.  Patient did not take any medications prior to arrival.  Patient, states nothing seems to make her condition, better or worse Past Medical History  Diagnosis Date  . Allergic rhinitis   . Anxiety   . Asthma   . History of nephrolithiasis   . 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 History of drug dependence 02/28/2013    Morphine changesd to methadone  And off 2014    Past Surgical History  Procedure Laterality Date  . No past surgeries     Family History  Problem Relation Age of Onset  . Hypertension Mother   . Anxiety disorder Mother   . Hypertension Father    History  Substance Use Topics  . Smoking status: Current Every Day Smoker -- 0.50 packs/day  . Smokeless tobacco: Not on file  . Alcohol Use: No   OB History   Grav Para Term Preterm Abortions TAB SAB Ect Mult Living                 Review of Systems  All other systems negative except as documented in the HPI. All pertinent positives and negatives as reviewed in the HPI.   Allergies  Review of patient's allergies indicates no known allergies.  Home Medications   Current Outpatient Rx  Name  Route  Sig  Dispense  Refill  . albuterol  (PROAIR HFA) 108 (90 BASE) MCG/ACT inhaler   Inhalation   Inhale 2 puffs into the lungs every 6 (six) hours as needed.         Marland Kitchen. ibuprofen (ADVIL,MOTRIN) 200 MG tablet   Oral   Take 200 mg by mouth every 6 (six) hours as needed.         Marland Kitchen. LORazepam (ATIVAN) 0.5 MG tablet      TAKE 1 TABLET BY MOUTH EVERY 6 HOURS AS NEEDED FOR ANXIETY   90 tablet   2   . sertraline (ZOLOFT) 50 MG tablet   Oral   Take 1 tablet (50 mg total) by mouth daily.   90 tablet   1    BP 124/86  Pulse 78  Temp(Src) 99.2 F (37.3 C) (Oral)  Resp 16  Wt 146 lb (66.225 kg)  SpO2 96%  LMP 06/15/2013 Physical Exam  Nursing note and vitals reviewed. Constitutional: She is oriented to person, place, and time. She appears well-developed and well-nourished. No distress.  HENT:  Head: Normocephalic and atraumatic.  Mouth/Throat: Oropharynx is clear and moist.  Eyes: Pupils are equal, round, and reactive to light.  Cardiovascular: Normal rate, regular rhythm and normal heart sounds.  Exam reveals no gallop and no friction rub.   No murmur heard. Pulmonary/Chest: Effort normal and breath sounds normal.  No respiratory distress.  Abdominal: Soft. Bowel sounds are normal. She exhibits no distension. There is no tenderness.  Neurological: She is alert and oriented to person, place, and time. She exhibits normal muscle tone. Coordination normal.  Skin: Skin is warm and dry.    ED Course  Procedures (including critical care time) Labs Review Labs Reviewed  URINALYSIS, ROUTINE W REFLEX MICROSCOPIC - Abnormal; Notable for the following:    APPearance CLOUDY (*)    Specific Gravity, Urine 1.034 (*)    Leukocytes, UA SMALL (*)    All other components within normal limits  URINE MICROSCOPIC-ADD ON - Abnormal; Notable for the following:    Squamous Epithelial / LPF FEW (*)    Bacteria, UA MANY (*)    Crystals CA OXALATE CRYSTALS (*)    All other components within normal limits  CBC WITH DIFFERENTIAL -  Abnormal; Notable for the following:    WBC 13.6 (*)    Hemoglobin 16.7 (*)    HCT 46.1 (*)    MCHC 36.2 (*)    Neutro Abs 8.3 (*)    Lymphs Abs 4.1 (*)    All other components within normal limits  COMPREHENSIVE METABOLIC PANEL - Abnormal; Notable for the following:    Sodium 136 (*)    Total Protein 8.5 (*)    All other components within normal limits  URINE CULTURE  POC URINE PREG, ED   Imaging Review Ct Abdomen Pelvis Wo Contrast  07/05/2013   CLINICAL DATA:  Flank pain  EXAM: CT ABDOMEN AND PELVIS WITHOUT CONTRAST  TECHNIQUE: Multidetector CT imaging of the abdomen and pelvis was performed following the standard protocol without intravenous contrast.  COMPARISON:  10/02/2007  FINDINGS: BODY WALL: Unremarkable.  LOWER CHEST: Unremarkable.  ABDOMEN/PELVIS:  Liver: No focal abnormality.  Biliary: No evidence of biliary obstruction or stone.  Pancreas: Unremarkable.  Spleen: Unremarkable.  Adrenals: Unremarkable.  Kidneys and ureters: No hydronephrosis or stone.  Bladder: Unremarkable.  Reproductive: Unremarkable.  Bowel: No obstruction. Normal appendix.  Retroperitoneum: No mass or adenopathy.  Peritoneum: Small volume free pelvic fluid, usually physiologic.  Vascular: No acute abnormality.  OSSEOUS: No acute abnormalities.  IMPRESSION: 1. No urolithiasis or hydronephrosis. 2. Normal appendix. 3. Physiologic volume free pelvic fluid.   Electronically Signed   By: Tiburcio Pea M.D.   On: 07/05/2013 00:54    Patient retreated for UTI, based on her urinalysis, and a culture.  She is advised return here as needed.  The CT scan did not show any significant findings.  Patient is advised of the results of all questions were answered.  Told to return here as needed.  Also advised followup with her primary care Dr. for further evaluation and recheck.  Told to increase her fluid intake   Carlyle Dolly, PA-C 07/05/13 0122

## 2013-07-05 NOTE — Discharge Instructions (Signed)
Your CT scan did not show any acute findings.  It appears that you have a urinary tract infection.  You will need to follow up with her primary care doctor and increase your fluid intake

## 2013-07-06 LAB — URINE CULTURE: Colony Count: 2000

## 2013-07-06 NOTE — ED Provider Notes (Signed)
Medical screening examination/treatment/procedure(s) were performed by non-physician practitioner and as supervising physician I was immediately available for consultation/collaboration.   Ferrin Liebig M Urijah Arko, MD 07/06/13 1335 

## 2013-07-09 ENCOUNTER — Encounter: Payer: Self-pay | Admitting: Family Medicine

## 2013-07-09 ENCOUNTER — Ambulatory Visit (INDEPENDENT_AMBULATORY_CARE_PROVIDER_SITE_OTHER): Payer: Managed Care, Other (non HMO) | Admitting: Family Medicine

## 2013-07-09 ENCOUNTER — Telehealth: Payer: Self-pay | Admitting: Internal Medicine

## 2013-07-09 VITALS — BP 108/70 | Temp 99.7°F | Wt 147.0 lb

## 2013-07-09 DIAGNOSIS — M549 Dorsalgia, unspecified: Secondary | ICD-10-CM

## 2013-07-09 LAB — CBC WITH DIFFERENTIAL/PLATELET
BASOS ABS: 0.1 10*3/uL (ref 0.0–0.1)
BASOS PCT: 0.5 % (ref 0.0–3.0)
Eosinophils Absolute: 0.2 10*3/uL (ref 0.0–0.7)
Eosinophils Relative: 1.6 % (ref 0.0–5.0)
HEMATOCRIT: 42.7 % (ref 36.0–46.0)
Hemoglobin: 14.4 g/dL (ref 12.0–15.0)
Lymphocytes Relative: 27.1 % (ref 12.0–46.0)
Lymphs Abs: 3.2 10*3/uL (ref 0.7–4.0)
MCHC: 33.8 g/dL (ref 30.0–36.0)
MCV: 94.3 fl (ref 78.0–100.0)
Monocytes Absolute: 0.9 10*3/uL (ref 0.1–1.0)
Monocytes Relative: 7.1 % (ref 3.0–12.0)
NEUTROS PCT: 63.7 % (ref 43.0–77.0)
Neutro Abs: 7.6 10*3/uL (ref 1.4–7.7)
Platelets: 323 10*3/uL (ref 150.0–400.0)
RBC: 4.52 Mil/uL (ref 3.87–5.11)
RDW: 13.5 % (ref 11.5–14.6)
WBC: 11.9 10*3/uL — ABNORMAL HIGH (ref 4.5–10.5)

## 2013-07-09 MED ORDER — CYCLOBENZAPRINE HCL 5 MG PO TABS
5.0000 mg | ORAL_TABLET | Freq: Three times a day (TID) | ORAL | Status: DC | PRN
Start: 2013-07-09 — End: 2013-08-21

## 2013-07-09 NOTE — Progress Notes (Signed)
Chief Complaint  Patient presents with  . Flank Pain    HPI:  Brandy Zuniga, a 28 yo F pt of Dr. Kirtland Zuniga, is here for f/u for back pain. -ED 3/6 CT and urine cx neg -started about 1 week ago -constant sharp pain in R mid back mod pain  -worse with nothing; reports they gave her pain medication in the ED which she is out of and that helped -denies: fevers, malaise, vomiting, diarrhea, constipation, urinary symptoms, vaginal symptoms, melena, hematochezia, blood in urine, weakness or numbness in legs, radiation of pain, not worried about STIs -FDLMP: 1 month ago -no regular exercise; cant think of any inciting event  ROS: See pertinent positives and negatives per HPI.  Past Medical History  Diagnosis Date  . Allergic rhinitis   . Anxiety   . Asthma   . History of nephrolithiasis   . 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 History of drug dependence 02/28/2013    Morphine changesd to methadone  And off 2014     Past Surgical History  Procedure Laterality Date  . No past surgeries      Family History  Problem Relation Age of Onset  . Hypertension Mother   . Anxiety disorder Mother   . Hypertension Father     History   Social History  . Marital Status: Unknown    Spouse Name: N/A    Number of Children: N/A  . Years of Education: N/A   Social History Main Topics  . Smoking status: Current Every Day Smoker -- 0.50 packs/day  . Smokeless tobacco: None  . Alcohol Use: No  . Drug Use: No  . Sexual Activity:    Other Topics Concern  . 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    Current outpatient prescriptions:albuterol (PROAIR HFA) 108 (90 BASE) MCG/ACT inhaler, Inhale 2 puffs into the lungs every 6 (six) hours as needed., Disp: , Rfl: ;  ibuprofen (ADVIL,MOTRIN) 200 MG tablet, Take 200 mg by mouth  every 6 (six) hours as needed., Disp: , Rfl: ;  LORazepam (ATIVAN) 0.5 MG tablet, TAKE 1 TABLET BY MOUTH EVERY 6 HOURS AS NEEDED FOR ANXIETY, Disp: 90 tablet, Rfl: 2 nitrofurantoin, macrocrystal-monohydrate, (MACROBID) 100 MG capsule, Take 1 capsule (100 mg total) by mouth 2 (two) times daily., Disp: 10 capsule, Rfl: 0;  sertraline (ZOLOFT) 50 MG tablet, Take 1 tablet (50 mg total) by mouth daily., Disp: 90 tablet, Rfl: 1;  cyclobenzaprine (FLEXERIL) 5 MG tablet, Take 1 tablet (5 mg total) by mouth 3 (three) times daily as needed for muscle spasms., Disp: 30 tablet, Rfl: 1  EXAM:  Filed Vitals:   07/09/13 0803  BP: 108/70  Temp: 99.7 F (37.6 C)    Body mass index is 25.22 kg/(m^2).  GENERAL: vitals reviewed and listed above, alert, oriented, appears well hydrated and in no acute distress  HEENT: atraumatic, conjunttiva clear, no obvious abnormalities on inspection of external nose and ears  NECK: no obvious masses on inspection  LUNGS: clear to auscultation bilaterally, no wheezes, rales or rhonchi, good air movement  CV: HRRR, no peripheral edema  ABD: BS+, soft, NTTP, no CVA TTP  MS: moves all extremities without noticeable abnormality, TTP specifically in QL muscle on the R and reproduces pt  symptoms, no bony TTP, normal gait, no weakness or numbness  PSYCH: pleasant and cooperative, no obvious depression or anxiety  ASSESSMENT AND PLAN:  Discussed the following assessment and plan:  Back pain - Plan: CBC with Differential, cyclobenzaprine (FLEXERIL) 5 MG tablet  -we discussed possible serious and likely etiologies, workup and treatment, treatment risks and return precautions -per hx and exam this seems more muscular and the CT and urine culture were negative but did have bacteria on micro in ED and she is completing a course of abx given in the ED -after this discussion, Brandy Zuniga opted for recs per orders and insturctions -follow up advised in 1 month, rechecking CBC given  mild abnormalities, but unless changed would have her follow up with PCP as advised -of course, we advised Brandy Zuniga  to return or notify a doctor immediately if symptoms worsen or persist or new concerns arise.  -Patient advised to return or notify a doctor immediately if symptoms worsen or persist or new concerns arise.  Patient Instructions  -We have ordered labs or studies at this visit. It can take up to 1-2 weeks for results and processing. We will contact you with instructions IF your results are abnormal. Normal results will be released to your [facility]. If you have not heard from us or can not find your results in [facility] in 2 weeks please contact our office.  -heat for 15 minutes twice daily  -flexeril nightly for 3 nights  -ibuprofen or tylenol per instructions as needed for pain  -exercises provided  -follow up with your doctor in 3-4 weeks or sooner if needed          Brandy Zuniga R.

## 2013-07-09 NOTE — Progress Notes (Signed)
Pre visit review using our clinic review tool, if applicable. No additional management support is needed unless otherwise documented below in the visit note. 

## 2013-07-09 NOTE — Patient Instructions (Signed)
-  We have ordered labs or studies at this visit. It can take up to 1-2 weeks for results and processing. We will contact you with instructions IF your results are abnormal. Normal results will be released to your [facility]. If you have not heard from us or can not find your results in [facility] in 2 weeks please contact our office.  -heat for 15 minutes twice daily  -flexeril nightly for 3 nights  -ibuprofen or tylenol per instructions as needed for pain  -exercises provided  -follow up with your doctor in 3-4 weeks or sooner if needed

## 2013-07-09 NOTE — Telephone Encounter (Signed)
Relevant patient education mailed to patient.  

## 2013-08-18 ENCOUNTER — Other Ambulatory Visit: Payer: Self-pay | Admitting: Internal Medicine

## 2013-08-18 NOTE — Telephone Encounter (Signed)
Pt called to fu on rx. Pt states she is out and wanted to help expedite rx. Thanks LORazepam (ATIVAN) 0.5 MG tablet

## 2013-08-21 ENCOUNTER — Ambulatory Visit (INDEPENDENT_AMBULATORY_CARE_PROVIDER_SITE_OTHER): Payer: Managed Care, Other (non HMO) | Admitting: Internal Medicine

## 2013-08-21 ENCOUNTER — Encounter: Payer: Self-pay | Admitting: Internal Medicine

## 2013-08-21 VITALS — BP 146/98 | HR 110 | Temp 97.9°F | Resp 24 | Ht 64.0 in | Wt 150.0 lb

## 2013-08-21 DIAGNOSIS — M79671 Pain in right foot: Secondary | ICD-10-CM

## 2013-08-21 DIAGNOSIS — J45909 Unspecified asthma, uncomplicated: Secondary | ICD-10-CM

## 2013-08-21 DIAGNOSIS — F1921 Other psychoactive substance dependence, in remission: Secondary | ICD-10-CM

## 2013-08-21 DIAGNOSIS — M79609 Pain in unspecified limb: Secondary | ICD-10-CM

## 2013-08-21 MED ORDER — TRAMADOL HCL 50 MG PO TABS: 50.0000 mg | ORAL_TABLET | Freq: Three times a day (TID) | ORAL | Status: DC | PRN

## 2013-08-21 NOTE — Progress Notes (Signed)
Pre-visit discussion using our clinic review tool. No additional management support is needed unless otherwise documented below in the visit note.  

## 2013-08-21 NOTE — Patient Instructions (Signed)
Orthopedic followup as discussed 
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
## 2013-08-22 ENCOUNTER — Telehealth: Payer: Self-pay | Admitting: Internal Medicine

## 2013-08-22 NOTE — Telephone Encounter (Signed)
Relevant patient education mailed to patient.  

## 2013-09-10 ENCOUNTER — Other Ambulatory Visit: Payer: Self-pay | Admitting: Orthopedic Surgery

## 2013-09-10 DIAGNOSIS — M19079 Primary osteoarthritis, unspecified ankle and foot: Secondary | ICD-10-CM

## 2013-09-12 ENCOUNTER — Ambulatory Visit
Admission: RE | Admit: 2013-09-12 | Discharge: 2013-09-12 | Disposition: A | Discharge status: Home / Self Care | Payer: Managed Care, Other (non HMO) | Source: Ambulatory Visit | Attending: Orthopedic Surgery | Admitting: Orthopedic Surgery

## 2013-09-12 DIAGNOSIS — M19079 Primary osteoarthritis, unspecified ankle and foot: Secondary | ICD-10-CM

## 2013-09-16 ENCOUNTER — Other Ambulatory Visit: Payer: Self-pay | Admitting: Internal Medicine

## 2013-11-06 ENCOUNTER — Telehealth: Payer: Self-pay | Admitting: Internal Medicine

## 2013-11-06 NOTE — Telephone Encounter (Signed)
Pt is needing a refill on her rxtraMADol (ULTRAM) 50 MG tablet for her foot, pt states dr. Kirtland Bouchardk had informed her to fu with her foot doctor, pt states that her foot dr is suggesting surgery, however pt states she can not afford the surgery right now because she is trying to get disability, however she does need something for pain until she can have the surgery. [facility] [address]

## 2013-11-07 MED ORDER — TRAMADOL HCL 50 MG PO TABS: 50.0000 mg | ORAL_TABLET | Freq: Three times a day (TID) | ORAL | Status: DC | PRN

## 2013-11-07 NOTE — Telephone Encounter (Signed)
Left detailed message Rx called into pharmacy. 

## 2013-11-10 ENCOUNTER — Telehealth: Payer: Self-pay | Admitting: Internal Medicine

## 2013-11-10 NOTE — Telephone Encounter (Signed)
Pt states [place]AM[place] pharm need auth for her to fill this early. Pt cannot fill untl 7/17.  [facility] [address] Pt  States she is in bad pain and needs rx asap

## 2013-11-11 NOTE — Telephone Encounter (Signed)
Discussed with Dr. Kirtland BouchardK, okay to let pharmacy refill Tramadol early. [facility] [place] she said pt picked up Rx yesterday. Told her okay.

## 2014-01-19 ENCOUNTER — Telehealth: Payer: Self-pay | Admitting: Internal Medicine

## 2014-01-19 MED ORDER — TRAMADOL HCL 50 MG PO TABS: 50.0000 mg | ORAL_TABLET | Freq: Three times a day (TID) | ORAL | Status: DC | PRN

## 2014-01-19 NOTE — Telephone Encounter (Signed)
Please advise if refill okay

## 2014-01-19 NOTE — Telephone Encounter (Signed)
Pt request refill of the following: traMADol (ULTRAM) 50 MG tablet    [facility] [address]

## 2014-01-19 NOTE — Telephone Encounter (Signed)
Ok 50

## 2014-01-19 NOTE — Telephone Encounter (Signed)
Left detailed message Rx called into pharmacy. 

## 2014-01-20 NOTE — Telephone Encounter (Signed)
Left message on voicemail to call office.  

## 2014-01-20 NOTE — Telephone Encounter (Signed)
Called pharmacy and spoke to Trent the pharmacist. He said there system is down, they are working on it, and to call back. Told him okay, will call back around 10:00.

## 2014-01-20 NOTE — Telephone Encounter (Signed)
Called pharmacy and spoke to [place], told him we ran a report on her and I saw she just had Tramadol Rx filled on 9/15 #90 tablets. Ree Kida said yes, that is why they held the Rx that was called in yesterday. Told him to cancel Rx Tramadol # 50 that I called in yesterday and I will let pt know. Ree Kida verbalized understanding.

## 2014-01-20 NOTE — Telephone Encounter (Signed)
Pt said the pharmacy would not let  her picked this up. They told her that they needed an override from [place].Marland Kitchen

## 2014-02-08 ENCOUNTER — Other Ambulatory Visit: Payer: Self-pay | Admitting: Internal Medicine

## 2014-02-11 NOTE — Telephone Encounter (Signed)
Okay to refill Tramadol? Last filled 9/15 #90.

## 2014-02-11 NOTE — Telephone Encounter (Signed)
ok 

## 2014-04-17 ENCOUNTER — Other Ambulatory Visit: Payer: Self-pay | Admitting: Internal Medicine

## 2014-05-22 ENCOUNTER — Other Ambulatory Visit: Payer: Self-pay | Admitting: Internal Medicine

## 2014-08-11 ENCOUNTER — Ambulatory Visit (INDEPENDENT_AMBULATORY_CARE_PROVIDER_SITE_OTHER): Payer: BLUE CROSS/BLUE SHIELD | Admitting: Family Medicine

## 2014-08-11 ENCOUNTER — Encounter: Payer: Self-pay | Admitting: Family Medicine

## 2014-08-11 VITALS — BP 132/90 | HR 90 | Temp 99.6°F | Wt 129.0 lb

## 2014-08-11 DIAGNOSIS — J4521 Mild intermittent asthma with (acute) exacerbation: Secondary | ICD-10-CM | POA: Diagnosis not present

## 2014-08-11 DIAGNOSIS — J209 Acute bronchitis, unspecified: Secondary | ICD-10-CM | POA: Diagnosis not present

## 2014-08-11 MED ORDER — AZITHROMYCIN 250 MG PO TABS: ORAL_TABLET | ORAL | Status: DC

## 2014-08-11 NOTE — Progress Notes (Signed)
Pre visit review using our clinic review tool, if applicable. No additional management support is needed unless otherwise documented below in the visit note. 

## 2014-08-11 NOTE — Progress Notes (Signed)
   Subjective:    Patient ID: Brandy Zuniga, female    DOB: 01-Apr-1986, 29 y.o.   MRN: [id number]  HPI Here for 3 days of fever, ST, chest tightness and wheezing, and coughing up green sputum. She is using her inhaler prn.    Review of Systems  Constitutional: Positive for fever.  HENT: Positive for congestion and postnasal drip. Negative for sinus pressure.   Eyes: Negative.   Respiratory: Positive for cough, chest tightness, shortness of breath and wheezing.   Cardiovascular: Negative.        Objective:   Physical Exam  Constitutional: She appears well-developed and well-nourished.  HENT:  Right Ear: External ear normal.  Left Ear: External ear normal.  Nose: Nose normal.  Mouth/Throat: Oropharynx is clear and moist.  Eyes: Conjunctivae are normal.  Pulmonary/Chest: Effort normal. No respiratory distress. She has no rales.  Scattered wheezes and rhonchi   Lymphadenopathy:    She has no cervical adenopathy.          Assessment & Plan:  Given a Zpack. Add Mucinex. Written out of work from 08-09-14 until 08-14-14.

## 2014-08-26 ENCOUNTER — Telehealth: Payer: Self-pay | Admitting: Internal Medicine

## 2014-08-26 ENCOUNTER — Other Ambulatory Visit: Payer: Self-pay | Admitting: Internal Medicine

## 2014-08-26 NOTE — Telephone Encounter (Signed)
Brandy MaxwellCheryl, pt needs to be seen. Has not seen Dr. Kirtland BouchardK since last April. Please schedule appt.

## 2014-08-26 NOTE — Telephone Encounter (Signed)
° ° ° ° ° °  Pt request refill of the following: ° ° °traMADol (ULTRAM) 50 MG tablet ° ° °Phamacy: °

## 2014-08-26 NOTE — Telephone Encounter (Signed)
Pt has been scheduled.  °

## 2014-09-01 ENCOUNTER — Ambulatory Visit (INDEPENDENT_AMBULATORY_CARE_PROVIDER_SITE_OTHER): Payer: BLUE CROSS/BLUE SHIELD | Admitting: Internal Medicine

## 2014-09-01 ENCOUNTER — Encounter: Payer: Self-pay | Admitting: Internal Medicine

## 2014-09-01 VITALS — BP 120/70 | HR 105 | Temp 98.0°F | Resp 18 | Ht 64.0 in | Wt 127.0 lb

## 2014-09-01 DIAGNOSIS — J4531 Mild persistent asthma with (acute) exacerbation: Secondary | ICD-10-CM

## 2014-09-01 MED ORDER — TRAMADOL HCL 50 MG PO TABS: 50.0000 mg | ORAL_TABLET | Freq: Four times a day (QID) | ORAL | Status: DC | PRN

## 2014-09-01 NOTE — Progress Notes (Signed)
Pre visit review using our clinic review tool, if applicable. No additional management support is needed unless otherwise documented below in the visit note. 

## 2014-09-01 NOTE — Patient Instructions (Addendum)
Call or return to clinic prn if these symptoms worsen or fail to improve as anticipated.  Orthopedic follow-up

## 2014-09-01 NOTE — Progress Notes (Signed)
Subjective:    Patient ID: Brandy Zuniga, female    DOB: [birth year], 29 y.o.   MRN: [id number]  HPI  29 year old patient who was seen about 3 weeks ago for exacerbation of asthma.  She has done quite well .  More recently. She continues to have chronic right foot pain.  She has been seen by orthopedics who have suggested that she would benefit from a fusion.  She has started a recent job and she is very reluctant to consider surgery at this time.  She has been treated with tramadol.  She does have a prior history of drug dependence.  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  She has increasing foot pain due to the long hours on her feet.  She does wear good foot wear  Past Medical History  Diagnosis Date  . Allergic rhinitis   . Anxiety   . Asthma   . History of nephrolithiasis   . 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 History of drug dependence 02/28/2013    Morphine changesd to methadone  And off 2014     History   Social History  . Marital Status: Unknown    Spouse Name: N/A  . Number of Children: N/A  . Years of Education: N/A   Occupational History  . Not on file.   Social History Main Topics  . Smoking status: Current Every Day Smoker -- 0.50 packs/day  . Smokeless tobacco: Not on file  . Alcohol Use: No  . Drug Use: No  . Sexual Activity: Not on file   Other Topics Concern  . 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 No past surgeries      Family History  Problem Relation Age of Onset  . Hypertension Mother   . Anxiety disorder Mother   . Hypertension Father     No Known Allergies  Current Outpatient Prescriptions on File Prior to Visit  Medication Sig Dispense Refill  . albuterol (PROAIR HFA) 108 (90  BASE) MCG/ACT inhaler Inhale 2 puffs into the lungs every 6 (six) hours as needed.     No current facility-administered medications on file prior to visit.    BP 120/70 mmHg  Pulse 105  Temp(Src) 98 F (36.7 C) (Oral)  Resp 18  Ht  (1.626 m)  Wt 127 lb (57.607 kg)  BMI 21.79 kg/m2  SpO2 99%     Review of Systems  Constitutional: Negative.   HENT: Negative for congestion, dental problem, hearing loss, rhinorrhea, sinus pressure, sore throat and tinnitus.   Eyes: Negative for pain, discharge and visual disturbance.  Respiratory: Negative for cough and shortness of breath.   Cardiovascular: Negative for chest pain, palpitations and leg swelling.  Gastrointestinal: Negative for nausea, vomiting, abdominal pain, diarrhea, constipation, blood in stool and abdominal distention.  Genitourinary: Negative for dysuria, urgency, frequency, hematuria, flank pain, vaginal bleeding, vaginal discharge, difficulty urinating, vaginal pain and pelvic pain.  Musculoskeletal: Positive for gait problem. Negative for joint swelling and arthralgias.       Chronic right foot pain  Skin: Negative for rash.  Neurological: Negative for dizziness, syncope, speech difficulty, weakness, numbness and headaches.  Hematological: Negative for adenopathy.  Psychiatric/Behavioral: Negative for behavioral problems, dysphoric mood and agitation. The patient is not nervous/anxious.        Objective:   Physical Exam  Constitutional: She is oriented to person, place, and time. She appears well-developed and well-nourished.  HENT:  Head: Normocephalic.  Right Ear: External ear normal.  Left Ear: External ear normal.  Mouth/Throat: Oropharynx is clear and moist.  Eyes: Conjunctivae and EOM are normal. Pupils are equal, round, and reactive to light.  Neck: Normal range of motion. Neck supple. No thyromegaly present.  Cardiovascular: Normal rate, regular rhythm, normal heart sounds and intact distal pulses.     Pulmonary/Chest: Effort normal and breath sounds normal. She has no wheezes.  Abdominal: Soft. Bowel sounds are normal. She exhibits no mass. There is no tenderness.  Musculoskeletal: Normal range of motion.  Surgical scar right lateral ankle  Lymphadenopathy:    She has no cervical adenopathy.  Neurological: She is alert and oriented to person, place, and time.  Skin: Skin is warm and dry. No rash noted.  Psychiatric: She has a normal mood and affect. Her behavior is normal.          Assessment & Plan:   Asthma exacerbation, stable History allergic rhinitis History of drug dependence Chronic right foot pain.  Tramadol refilled.  Follow-up orthopedics

## 2014-11-24 ENCOUNTER — Telehealth: Payer: Self-pay | Admitting: Internal Medicine

## 2014-11-24 MED ORDER — PERMETHRIN 5 % EX CREA: 1.0000 "application " | TOPICAL_CREAM | Freq: Once | CUTANEOUS | Status: DC

## 2014-11-24 NOTE — Telephone Encounter (Signed)
Spoke to pt told her Rx for Permethrin 5% cream was sent to pharmacy. Need to apply from neck to soles of feet including finger and toenails; Leave overnight and shower in am.  Wash all linens, towels, ect. Pt verbalized understanding.

## 2014-11-24 NOTE — Telephone Encounter (Signed)
permethrin 5%  30 gm

## 2014-11-24 NOTE — Telephone Encounter (Signed)
Please see message and advise 
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
## 2014-12-01 ENCOUNTER — Other Ambulatory Visit: Payer: Self-pay | Admitting: Internal Medicine

## 2014-12-29 ENCOUNTER — Telehealth: Payer: Self-pay | Admitting: Internal Medicine

## 2014-12-29 NOTE — Telephone Encounter (Signed)
Pt needs early refill on tramadol 50 mg #[facility] [address]. Pt had to leave her home and may have left med behind. Pt landlord changed locks etc.

## 2014-12-29 NOTE — Telephone Encounter (Signed)
Left message on voicemail to call office.  

## 2014-12-30 NOTE — Telephone Encounter (Signed)
Left message on voicemail to call office.  

## 2014-12-30 NOTE — Telephone Encounter (Signed)
Pt called back, told her she has 2 refills left on prescription for Tramadol was called in on 8/3. Told pt just need to call pharmacy and tell them you need a refill but may not be covered by insurance till Sept 3rd and I can not change that, the pharmacist will be able to tell you. Pt verbalized understanding.

## 2015-02-25 ENCOUNTER — Other Ambulatory Visit: Payer: Self-pay | Admitting: Internal Medicine

## 2015-05-25 ENCOUNTER — Other Ambulatory Visit: Payer: Self-pay | Admitting: Internal Medicine

## 2015-05-27 ENCOUNTER — Other Ambulatory Visit: Payer: Self-pay | Admitting: Internal Medicine

## 2015-08-11 ENCOUNTER — Telehealth: Payer: Self-pay | Admitting: Internal Medicine

## 2015-08-11 NOTE — Telephone Encounter (Signed)
Pt need new Rx for Tramadol 50 mg.  [facility] [address]

## 2015-08-12 MED ORDER — TRAMADOL HCL 50 MG PO TABS: 50.0000 mg | ORAL_TABLET | Freq: Four times a day (QID) | ORAL | Status: DC | PRN

## 2015-08-12 NOTE — Telephone Encounter (Signed)
Left message on voicemail to call office.  

## 2015-08-16 NOTE — Telephone Encounter (Signed)
Spoke to pt, told her Rx was called into pharmacy on Thursday but I wanted to tell you that you need a physical. Need to call back and schedule. Pt verbalized understanding.

## 2015-10-01 ENCOUNTER — Encounter: Payer: Self-pay | Admitting: Internal Medicine

## 2015-10-01 ENCOUNTER — Ambulatory Visit (INDEPENDENT_AMBULATORY_CARE_PROVIDER_SITE_OTHER): Payer: BLUE CROSS/BLUE SHIELD | Admitting: Internal Medicine

## 2015-10-01 VITALS — BP 110/78 | HR 97 | Temp 98.3°F | Resp 20 | Ht 64.0 in | Wt 125.0 lb

## 2015-10-01 DIAGNOSIS — J4521 Mild intermittent asthma with (acute) exacerbation: Secondary | ICD-10-CM

## 2015-10-01 MED ORDER — ALBUTEROL SULFATE HFA 108 (90 BASE) MCG/ACT IN AERS: 2.0000 | INHALATION_SPRAY | Freq: Four times a day (QID) | RESPIRATORY_TRACT | Status: DC | PRN

## 2015-10-01 MED ORDER — PREDNISONE 20 MG PO TABS: 20.0000 mg | ORAL_TABLET | Freq: Two times a day (BID) | ORAL | Status: DC

## 2015-10-01 MED ORDER — TRAMADOL HCL 50 MG PO TABS: 50.0000 mg | ORAL_TABLET | Freq: Four times a day (QID) | ORAL | Status: DC | PRN

## 2015-10-01 NOTE — Progress Notes (Signed)
Pre visit review using our clinic review tool, if applicable. No additional management support is needed unless otherwise documented below in the visit note. 

## 2015-10-01 NOTE — Progress Notes (Signed)
Subjective:    Patient ID: Brandy Zuniga, female    DOB: 1986-03-05, 30 y.o.   MRN: [id number]  HPI  30 year old patient who has a history of mild, could asthma.  She has developed a URI over the past week with nasal and chest congestion.  She has had nonproductive cough and worsening wheezing.  She has been out of albuterol MDI.  No fever or significant dyspnea.  No chest pain.  Past Medical History  Diagnosis Date  . Allergic rhinitis   . Anxiety   . Asthma   . History of nephrolithiasis   . 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 History of drug dependence (HCC) 02/28/2013    Morphine changesd to methadone  And off 2014      Social History   Social History  . Marital Status: Unknown    Spouse Name: N/A  . Number of Children: N/A  . Years of Education: N/A   Occupational History  . Not on file.   Social History Main Topics  . Smoking status: Current Every Day Smoker -- 0.50 packs/day  . Smokeless tobacco: Not on file  . Alcohol Use: No  . Drug Use: No  . Sexual Activity: Not on file   Other Topics Concern  . 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 No past surgeries      Family History  Problem Relation Age of Onset  . Hypertension Mother   . Anxiety disorder Mother   . Hypertension Father     No Known Allergies  No current outpatient prescriptions on file prior to visit.   No current facility-administered medications on file prior to visit.    BP 110/78 mmHg  Pulse 97  Temp(Src) 98.3 F (36.8 C) (Oral)  Resp 20  Ht 5\' 4"  (1.626 m)  Wt 125 lb (56.7 kg)  BMI 21.45 kg/m2  SpO2 98%     Review of Systems  Constitutional: Negative.  Negative for fever.  HENT: Positive for congestion and sinus pressure.  Negative for dental problem, hearing loss, rhinorrhea, sore throat and tinnitus.   Eyes: Negative for pain, discharge and visual disturbance.  Respiratory: Positive for cough and wheezing. Negative for shortness of breath.   Cardiovascular: Negative for chest pain, palpitations and leg swelling.  Gastrointestinal: Negative for nausea, vomiting, abdominal pain, diarrhea, constipation, blood in stool and abdominal distention.  Genitourinary: Negative for dysuria, urgency, frequency, hematuria, flank pain, vaginal bleeding, vaginal discharge, difficulty urinating, vaginal pain and pelvic pain.  Musculoskeletal: Negative for joint swelling, arthralgias and gait problem.  Skin: Negative for rash.  Neurological: Negative for dizziness, syncope, speech difficulty, weakness, numbness and headaches.  Hematological: Negative for adenopathy.  Psychiatric/Behavioral: Negative for behavioral problems, dysphoric mood and agitation. The patient is not nervous/anxious.        Objective:   Physical Exam  Constitutional: She is oriented to person, place, and time. She appears well-developed and well-nourished. No distress.  HENT:  Head: Normocephalic.  Right Ear: External ear normal.  Left Ear: External ear normal.  Oropharynx mildly erythematous without exudate  Eyes: Conjunctivae and EOM are normal. Pupils are equal, round, and reactive to light.  Neck: Normal range  of motion. Neck supple. No thyromegaly present.  Mild thyroid enlargement  Cardiovascular: Normal rate, regular rhythm, normal heart sounds and intact distal pulses.   Pulmonary/Chest: Effort normal. No respiratory distress. She has wheezes. She has no rales.  A few scattered faint expiratory wheezes  Abdominal: Soft. Bowel sounds are normal. She exhibits no mass. There is no tenderness.  Musculoskeletal: Normal range of motion.  Lymphadenopathy:    She has no cervical adenopathy.  Neurological: She is alert and oriented to person, place,  and time.  Skin: Skin is warm and dry. No rash noted.  Psychiatric: She has a normal mood and affect. Her behavior is normal.          Assessment & Plan:   Viral URI with mild asthmatic bronchitis.  Will force fluids.  Treat with Mucinex.  Will refill albuterol MDI.  Will chew with prednisone 20 mg twice a day for 6 days  Rogelia Boga, MD

## 2015-10-01 NOTE — Patient Instructions (Signed)
Acute bronchitis symptoms for less than 10 days are generally not helped by antibiotics.  Take over-the-counter expectorants and cough medications such as  Mucinex DM.  Call if there is no improvement in 5 to 7 days or if  you develop worsening cough, fever, or new symptoms, such as shortness of breath or chest pain.  Drink as much fluid as you  can tolerate over the next few days  Call or return to clinic prn if these symptoms worsen or fail to improve as anticipated.  

## 2015-12-31 ENCOUNTER — Encounter (HOSPITAL_COMMUNITY): Payer: Self-pay | Admitting: *Deleted

## 2015-12-31 ENCOUNTER — Emergency Department (HOSPITAL_COMMUNITY)
Admission: EM | Admit: 2015-12-31 | Discharge: 2015-12-31 | Disposition: A | Discharge status: Home / Self Care | Payer: BLUE CROSS/BLUE SHIELD | Attending: Emergency Medicine | Admitting: Emergency Medicine

## 2015-12-31 DIAGNOSIS — L02511 Cutaneous abscess of right hand: Secondary | ICD-10-CM | POA: Insufficient documentation

## 2015-12-31 DIAGNOSIS — J45901 Unspecified asthma with (acute) exacerbation: Secondary | ICD-10-CM | POA: Insufficient documentation

## 2015-12-31 DIAGNOSIS — F17200 Nicotine dependence, unspecified, uncomplicated: Secondary | ICD-10-CM | POA: Insufficient documentation

## 2015-12-31 DIAGNOSIS — Z79899 Other long term (current) drug therapy: Secondary | ICD-10-CM | POA: Insufficient documentation

## 2015-12-31 DIAGNOSIS — L0291 Cutaneous abscess, unspecified: Secondary | ICD-10-CM

## 2015-12-31 MED ORDER — CEPHALEXIN 500 MG PO CAPS
500.0000 mg | ORAL_CAPSULE | Freq: Once | ORAL | Status: AC
  Administered 2015-12-31: 500 mg via ORAL
  Filled 2015-12-31: qty 1

## 2015-12-31 MED ORDER — CEPHALEXIN 500 MG PO CAPS: 500.0000 mg | ORAL_CAPSULE | Freq: Four times a day (QID) | ORAL | 0 refills | Status: DC
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
## 2015-12-31 NOTE — Discharge Instructions (Signed)
Soak and gently massage finger three times per day.  Recheck in 3 days if not improving, or at any time if worse.

## 2015-12-31 NOTE — ED Notes (Signed)
Bed: WTR6 Expected date:  Expected time:  Means of arrival:  Comments: 

## 2015-12-31 NOTE — ED Triage Notes (Signed)
Pt presents with infected wound to R ring finger x 1 week.  Swelling and mild ecchymosis noted.  Pt reports she tried to drain it yesterday without relief.

## 2016-06-01 ENCOUNTER — Telehealth: Payer: Self-pay | Admitting: Internal Medicine

## 2016-06-01 NOTE — Telephone Encounter (Signed)
[facility] - Client TELEPHONE ADVICE RECORD [facility] Patient Name: Brandy CaulLYSSA Zuniga DOB: 07/09/1985 Initial Comment Caller States they have a prescription that was not called in. Nurse Assessment Nurse: Josie SaundersGerard, RN, Erskine SquibbJane Date/Time Lamount Cohen(Eastern Time): 06/01/2016 4:21:52 PM Confirm and document reason for call. If symptomatic, describe symptoms. ---Caller reports she went [facility] to detox. Doe snot have insurance. One discharge med is 2,000 dollars. Was placed on Robaxin, Lyrica, seroquel, Topomax. Does not have the Lyrica ( 50 mg Po TID ) Is to go to rehab on Tuesday next week. Does the patient have any new or worsening symptoms? ---Yes Will a triage be completed? ---Yes Related visit to physician within the last 2 weeks? ---Marlou SaYesDoes the PT have any chronic conditions? (i.e. diabetes, asthma, etc.) ---Unknown Is the patient pregnant or possibly pregnant? (Ask all females between the ages of 3712-55) ---No Is this a behavioral health or substance abuse call? ---No Guidelines Guideline Title Affirmed Question Affirmed Notes Final Disposition User Clinical Call Gaddy, RN, Felicia Comments No answer PT was discharged today. The lyrica cost $2000. She has perscription Her nurse coordinator called her back with a coupon - to get Lyrica for $600 so she feels the reason she called has been taken care of. She declined triage for any symptoms.Call Id: [phone]

## 2016-06-02 NOTE — Telephone Encounter (Signed)
Noted  

## 2016-06-09 ENCOUNTER — Emergency Department (HOSPITAL_BASED_OUTPATIENT_CLINIC_OR_DEPARTMENT_OTHER): Payer: Self-pay

## 2016-06-09 ENCOUNTER — Emergency Department (HOSPITAL_BASED_OUTPATIENT_CLINIC_OR_DEPARTMENT_OTHER)
Admission: EM | Admit: 2016-06-09 | Discharge: 2016-06-09 | Disposition: A | Discharge status: Home / Self Care | Payer: Self-pay | Attending: Emergency Medicine | Admitting: Emergency Medicine

## 2016-06-09 ENCOUNTER — Encounter (HOSPITAL_BASED_OUTPATIENT_CLINIC_OR_DEPARTMENT_OTHER): Payer: Self-pay | Admitting: *Deleted

## 2016-06-09 DIAGNOSIS — J45901 Unspecified asthma with (acute) exacerbation: Secondary | ICD-10-CM | POA: Insufficient documentation

## 2016-06-09 DIAGNOSIS — M79671 Pain in right foot: Secondary | ICD-10-CM

## 2016-06-09 DIAGNOSIS — G8929 Other chronic pain: Secondary | ICD-10-CM | POA: Insufficient documentation

## 2016-06-09 DIAGNOSIS — F17200 Nicotine dependence, unspecified, uncomplicated: Secondary | ICD-10-CM | POA: Insufficient documentation

## 2016-06-09 DIAGNOSIS — G2581 Restless legs syndrome: Secondary | ICD-10-CM | POA: Insufficient documentation

## 2016-06-09 DIAGNOSIS — Z79899 Other long term (current) drug therapy: Secondary | ICD-10-CM | POA: Insufficient documentation

## 2016-06-09 MED ORDER — GABAPENTIN 300 MG PO CAPS: 300.0000 mg | ORAL_CAPSULE | Freq: Two times a day (BID) | ORAL | 0 refills | Status: DC | PRN

## 2016-06-09 MED ORDER — IBUPROFEN 400 MG PO TABS
400.0000 mg | ORAL_TABLET | Freq: Once | ORAL | Status: AC | PRN
  Administered 2016-06-09: 400 mg via ORAL
  Filled 2016-06-09 (×2): qty 1
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
## 2016-06-09 NOTE — ED Notes (Signed)
Patient transported to X-ray 

## 2016-06-09 NOTE — ED Triage Notes (Addendum)
Chronic right foot pain. States she has been in pt at [facility] day.

## 2016-06-09 NOTE — Discharge Instructions (Signed)
Please try the Neurontin 1 tablet up to 2 times per day as needed for restless leg and pain.  Continue taking motrin and tylenol for pain. Ambulate as able. Rest, ice and elevated your right leg.

## 2016-06-09 NOTE — ED Notes (Signed)
Supervisor at [facility] spoke to  About the Gabapentin script and Pt. Seeing a PCP asap.  RN Earlene PlaterDavis was told the Pt. Counselor would be given all information and this would be taken care of by the Pt. Counselor.  RN explained this to the Pt.   PA C Joselyn Glassmanyler was told and heard conversation on phone that RN Earlene PlaterDavis had with the [facility].

## 2016-06-09 NOTE — ED Notes (Signed)
ED Provider at bedside. 

## 2016-06-16 ENCOUNTER — Ambulatory Visit (INDEPENDENT_AMBULATORY_CARE_PROVIDER_SITE_OTHER): Payer: Self-pay | Admitting: Internal Medicine

## 2016-06-16 ENCOUNTER — Encounter: Payer: Self-pay | Admitting: Internal Medicine

## 2016-06-16 VITALS — BP 122/66 | HR 99 | Temp 98.8°F | Ht 64.0 in | Wt 122.2 lb

## 2016-06-16 DIAGNOSIS — F1921 Other psychoactive substance dependence, in remission: Secondary | ICD-10-CM

## 2016-06-16 MED ORDER — GABAPENTIN 300 MG PO CAPS: 300.0000 mg | ORAL_CAPSULE | Freq: Two times a day (BID) | ORAL | 1 refills | Status: DC | PRN

## 2016-06-16 NOTE — Patient Instructions (Signed)
Call or return to clinic prn if these symptoms worsen or fail to improve as anticipated.  Behavioral health follow-up as scheduled  Good Luck!
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
## 2016-06-16 NOTE — Progress Notes (Signed)
Pre visit review using our clinic review tool, if applicable. No additional management support is needed unless otherwise documented below in the visit note. 

## 2016-08-31 ENCOUNTER — Other Ambulatory Visit: Payer: Self-pay | Admitting: Internal Medicine

## 2016-10-02 ENCOUNTER — Other Ambulatory Visit: Payer: Self-pay | Admitting: Internal Medicine

## 2017-05-01 NOTE — L&D Delivery Note (Signed)
[facility]  Delivery Note:  C-section       03/02/2018  3:31 PM  I was called to the operating room at the request of the patient's obstetrician (Dr. Chestine Spore) for a primary c-section for failure to progress.  PRENATAL HX:  This is a 32 y/o G1P0 at 70 and 6/[redacted] weeks gestation who was admitted for IOL due to gestational hypertension.  Her pregnancy has also been complicated by A1 GDM and polyhydramnios.  History of heroin use but not during pregnancy.  She is GBS negative with ROM x14 hours. C-section for failure to progress.    DELIVERY:  Infant was not vigorous at delivery so cord clamping was not delayed.  Infant apneic with HR < 60 despite standard warming, drying and stimulation.  PPV initiated at 1 minute of age and after 30 seconds HR improved to 150s.  O2 saturations appropriate thereafter without any respiratory support and were in upper 90s by 10 minutes of age. APGARs 3 and 8.  Exam within normal limits.  After 10 minutes, baby left with nurse to assist parents with skin-to-skin care.   _____________________ Electronically Signed By: Maryan Char, MD Neonatologist

## 2018-01-02 ENCOUNTER — Encounter: Payer: Medicaid Other | Attending: Obstetrics and Gynecology | Admitting: Registered"

## 2018-01-02 DIAGNOSIS — O24419 Gestational diabetes mellitus in pregnancy, unspecified control: Secondary | ICD-10-CM | POA: Insufficient documentation

## 2018-01-02 DIAGNOSIS — O99810 Abnormal glucose complicating pregnancy: Secondary | ICD-10-CM

## 2018-01-02 DIAGNOSIS — Z713 Dietary counseling and surveillance: Secondary | ICD-10-CM | POA: Insufficient documentation

## 2018-01-08 ENCOUNTER — Encounter: Payer: Self-pay | Admitting: Registered"

## 2018-01-08 DIAGNOSIS — O99810 Abnormal glucose complicating pregnancy: Secondary | ICD-10-CM | POA: Insufficient documentation

## 2018-01-08 NOTE — Progress Notes (Signed)
Patient was seen on 01/02/2018 for Gestational Diabetes self-management class at the [facility]. The following learning objectives were met by the patient during this course:   States the definition of Gestational Diabetes  States why dietary management is important in controlling blood glucose  Describes the effects each nutrient has on blood glucose levels  Demonstrates ability to create a balanced meal plan  Demonstrates carbohydrate counting   States when to check blood glucose levels  Demonstrates proper blood glucose monitoring techniques  States the effect of stress and exercise on blood glucose levels  States the importance of limiting caffeine and abstaining from alcohol and smoking  Blood glucose monitor given: Accu chek guide Lot # [id number] Exp: 09/08/18 Blood glucose reading: 106  Patient instructed to monitor glucose levels: FBS: 60 - <95; 1 hour: <140; 2 hour: <120  Patient received handouts:  Nutrition Diabetes and Pregnancy, including carb counting list  Patient will be seen for follow-up as needed.

## 2018-03-01 ENCOUNTER — Other Ambulatory Visit: Payer: Self-pay

## 2018-03-01 ENCOUNTER — Encounter (HOSPITAL_COMMUNITY): Payer: Self-pay | Admitting: *Deleted

## 2018-03-01 ENCOUNTER — Inpatient Hospital Stay (HOSPITAL_COMMUNITY)
Admission: AD | Admit: 2018-03-01 | Discharge: 2018-03-05 | DRG: 788 | Disposition: A | Discharge status: Home / Self Care | Payer: Medicaid Other | Attending: Obstetrics | Admitting: Obstetrics

## 2018-03-01 DIAGNOSIS — F17210 Nicotine dependence, cigarettes, uncomplicated: Secondary | ICD-10-CM | POA: Diagnosis present

## 2018-03-01 DIAGNOSIS — Z23 Encounter for immunization: Secondary | ICD-10-CM

## 2018-03-01 DIAGNOSIS — O134 Gestational [pregnancy-induced] hypertension without significant proteinuria, complicating childbirth: Secondary | ICD-10-CM | POA: Diagnosis present

## 2018-03-01 DIAGNOSIS — O133 Gestational [pregnancy-induced] hypertension without significant proteinuria, third trimester: Secondary | ICD-10-CM | POA: Diagnosis present

## 2018-03-01 DIAGNOSIS — Z3A39 39 weeks gestation of pregnancy: Secondary | ICD-10-CM

## 2018-03-01 DIAGNOSIS — O403XX0 Polyhydramnios, third trimester, not applicable or unspecified: Secondary | ICD-10-CM | POA: Diagnosis present

## 2018-03-01 DIAGNOSIS — O99334 Smoking (tobacco) complicating childbirth: Secondary | ICD-10-CM | POA: Diagnosis present

## 2018-03-01 DIAGNOSIS — O24420 Gestational diabetes mellitus in childbirth, diet controlled: Secondary | ICD-10-CM | POA: Diagnosis present

## 2018-03-01 HISTORY — DX: Gestational (pregnancy-induced) hypertension without significant proteinuria, third trimester: O13.3

## 2018-03-01 LAB — COMPREHENSIVE METABOLIC PANEL
ALK PHOS: 162 U/L — AB (ref 38–126)
ALT: 26 U/L (ref 0–44)
ANION GAP: 8 (ref 5–15)
AST: 26 U/L (ref 15–41)
Albumin: 3.2 g/dL — ABNORMAL LOW (ref 3.5–5.0)
BILIRUBIN TOTAL: 0.5 mg/dL (ref 0.3–1.2)
BUN: 13 mg/dL (ref 6–20)
CALCIUM: 9.1 mg/dL (ref 8.9–10.3)
CO2: 19 mmol/L — ABNORMAL LOW (ref 22–32)
Chloride: 107 mmol/L (ref 98–111)
Creatinine, Ser: 0.81 mg/dL (ref 0.44–1.00)
Glucose, Bld: 109 mg/dL — ABNORMAL HIGH (ref 70–99)
Potassium: 3.9 mmol/L (ref 3.5–5.1)
Sodium: 134 mmol/L — ABNORMAL LOW (ref 135–145)
TOTAL PROTEIN: 6.3 g/dL — AB (ref 6.5–8.1)

## 2018-03-01 LAB — OB RESULTS CONSOLE RPR: RPR: NONREACTIVE

## 2018-03-01 LAB — OB RESULTS CONSOLE ANTIBODY SCREEN: Antibody Screen: NEGATIVE

## 2018-03-01 LAB — RAPID URINE DRUG SCREEN, HOSP PERFORMED
Amphetamines: NOT DETECTED
Barbiturates: NOT DETECTED
Benzodiazepines: NOT DETECTED
Cocaine: NOT DETECTED
OPIATES: NOT DETECTED
Tetrahydrocannabinol: NOT DETECTED

## 2018-03-01 LAB — CBC
HCT: 42.3 % (ref 36.0–46.0)
HEMOGLOBIN: 14.3 g/dL (ref 12.0–15.0)
MCH: 31.8 pg (ref 26.0–34.0)
MCHC: 33.8 g/dL (ref 30.0–36.0)
MCV: 94.2 fL (ref 80.0–100.0)
PLATELETS: 212 10*3/uL (ref 150–400)
RBC: 4.49 MIL/uL (ref 3.87–5.11)
RDW: 13.7 % (ref 11.5–15.5)
WBC: 12.3 10*3/uL — AB (ref 4.0–10.5)
nRBC: 0 % (ref 0.0–0.2)

## 2018-03-01 LAB — GLUCOSE, CAPILLARY
GLUCOSE-CAPILLARY: 125 mg/dL — AB (ref 70–99)
Glucose-Capillary: 101 mg/dL — ABNORMAL HIGH (ref 70–99)
Glucose-Capillary: 70 mg/dL (ref 70–99)

## 2018-03-01 LAB — PROTEIN / CREATININE RATIO, URINE
CREATININE, URINE: 72 mg/dL
Protein Creatinine Ratio: 0.11 mg/mg{Cre} (ref 0.00–0.15)
Total Protein, Urine: 8 mg/dL

## 2018-03-01 LAB — OB RESULTS CONSOLE GC/CHLAMYDIA
CHLAMYDIA, DNA PROBE: NEGATIVE
GC PROBE AMP, GENITAL: NEGATIVE

## 2018-03-01 LAB — OB RESULTS CONSOLE HEPATITIS B SURFACE ANTIGEN: Hepatitis B Surface Ag: NEGATIVE

## 2018-03-01 LAB — OB RESULTS CONSOLE RUBELLA ANTIBODY, IGM: RUBELLA: IMMUNE

## 2018-03-01 LAB — OB RESULTS CONSOLE ABO/RH: RH Type: POSITIVE

## 2018-03-01 LAB — TYPE AND SCREEN
ABO/RH(D): B POS
ANTIBODY SCREEN: NEGATIVE

## 2018-03-01 LAB — OB RESULTS CONSOLE HIV ANTIBODY (ROUTINE TESTING): HIV: NONREACTIVE

## 2018-03-01 LAB — ABO/RH: ABO/RH(D): B POS

## 2018-03-01 LAB — OB RESULTS CONSOLE GBS: STREP GROUP B AG: NEGATIVE

## 2018-03-01 MED ORDER — ACETAMINOPHEN 325 MG PO TABS: 650.0000 mg | ORAL_TABLET | ORAL | Status: DC | PRN

## 2018-03-01 MED ORDER — OXYTOCIN 40 UNITS IN LACTATED RINGERS INFUSION - SIMPLE MED
1.0000 m[IU]/min | INTRAVENOUS | Status: DC
  Administered 2018-03-01: 2 m[IU]/min via INTRAVENOUS
  Administered 2018-03-01: 8 m[IU]/min via INTRAVENOUS
  Administered 2018-03-02: 10 m[IU]/min via INTRAVENOUS
  Filled 2018-03-01: qty 1000

## 2018-03-01 MED ORDER — LIDOCAINE HCL (PF) 1 % IJ SOLN
30.0000 mL | INTRAMUSCULAR | Status: DC | PRN
  Filled 2018-03-01: qty 30

## 2018-03-01 MED ORDER — ONDANSETRON HCL 4 MG/2ML IJ SOLN: 4.0000 mg | Freq: Four times a day (QID) | INTRAMUSCULAR | Status: DC | PRN

## 2018-03-01 MED ORDER — OXYCODONE-ACETAMINOPHEN 5-325 MG PO TABS: 2.0000 | ORAL_TABLET | ORAL | Status: DC | PRN

## 2018-03-01 MED ORDER — FENTANYL CITRATE (PF) 100 MCG/2ML IJ SOLN
50.0000 ug | INTRAMUSCULAR | Status: DC | PRN
  Administered 2018-03-02: 100 ug via INTRAVENOUS
  Filled 2018-03-01: qty 2

## 2018-03-01 MED ORDER — LACTATED RINGERS IV SOLN: 500.0000 mL | INTRAVENOUS | Status: DC | PRN

## 2018-03-01 MED ORDER — TERBUTALINE SULFATE 1 MG/ML IJ SOLN: 0.2500 mg | Freq: Once | INTRAMUSCULAR | Status: DC | PRN

## 2018-03-01 MED ORDER — SOD CITRATE-CITRIC ACID 500-334 MG/5ML PO SOLN
30.0000 mL | ORAL | Status: DC | PRN
  Administered 2018-03-02 (×2): 30 mL via ORAL
  Filled 2018-03-01 (×2): qty 15

## 2018-03-01 MED ORDER — OXYTOCIN 40 UNITS IN LACTATED RINGERS INFUSION - SIMPLE MED: 2.5000 [IU]/h | INTRAVENOUS | Status: DC

## 2018-03-01 MED ORDER — OXYCODONE-ACETAMINOPHEN 5-325 MG PO TABS: 1.0000 | ORAL_TABLET | ORAL | Status: DC | PRN

## 2018-03-01 MED ORDER — OXYTOCIN BOLUS FROM INFUSION: 500.0000 mL | Freq: Once | INTRAVENOUS | Status: DC

## 2018-03-01 MED ORDER — LACTATED RINGERS IV SOLN
INTRAVENOUS | Status: DC
  Administered 2018-03-01 (×2): via INTRAVENOUS
  Administered 2018-03-02: 125 mL/h via INTRAVENOUS
  Administered 2018-03-02 (×2): via INTRAVENOUS

## 2018-03-01 NOTE — H&P (Signed)
32 y.o. G1P0000 @ [redacted]w[redacted]d presents with new diagnosis of gestational hypertension for induction of labor.  She presented to the office today for a routine prenatal visit and was found to have diastolic BPs consistently >90.  Given gestational age > 39 weeks, the decision was made to proceed immediately with induction of labor.  She is asymptomatic and labs were wnl on admission.  Otherwise has good fetal movement and no bleeding.  Pregnancy c/b: 1. GMDA1:  Growth [place] at 35 weeks 2881 gm (6#6) 76%.  Growth [place] today 4039 gm( 8lb 14oz) 89%.  Mild polyhydramnios with DVP 10 cm.  2. History of heroin abuse--sober x 14 months prior to first prenatal visit   Past Medical History:  Diagnosis Date  . Allergic rhinitis   . Anxiety   . Asthma   . 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 History of nephrolithiasis   . 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 NO PAST SURGERIES      OB History  Gravida Para Term Preterm AB Living  1 0 0 0 0 0  SAB TAB Ectopic Multiple Live Births  0 0 0 0 0    # Outcome Date GA Lbr Len/2nd Weight Sex Delivery Anes PTL Lv  1 Current             Social History   Socioeconomic History  . Marital status: Single    Spouse name: Not on file  . Number of children: Not on file  . Years of education: Not on file  . Highest education level: Not on file  Occupational History  . Not on file  Social Needs  . Financial resource strain: Not on file  . Food insecurity:    Worry: Not on file    Inability: Not on file  . Transportation needs:    Medical: Not on file    Non-medical: Not on file  Tobacco Use  . Smoking status: Current Every Day Smoker    Packs/day: 0.50  . Smokeless tobacco: Never Used  Substance and Sexual Activity  . Alcohol use: No  . Drug use: No  . Sexual activity:  Not on file  Lifestyle  . Physical activity:    Days per week: Not on file    Minutes per session: Not on file  . 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    Prenatal Transfer Tool  Maternal Diabetes: Yes:  Diabetes Type:  Diet controlled Genetic Screening: Normal Maternal Ultrasounds/Referrals: Normal Fetal Ultrasounds or other Referrals:  None Maternal Substance Abuse:  No, history of heroin abuse Significant Maternal Medications:  None Significant Maternal Lab Results: Lab values include: Group B Strep negative  ABO, Rh: --/--/B POS (11/01 1620) Antibody: NEG (11/01 1620) Rubella: Immune (11/01 1606) RPR: Nonreactive (11/01 1606)  HBsAg: Negative (11/01 1606)  HIV: Non-reactive (11/01 1606)  GBS: Negative (11/01 1606)       Vitals:   03/01/18 1619 03/01/18 1629  BP:  134/85  Pulse:  94  Temp: 98.4 F (36.9 C)      General:  NAD Abdomen:  soft, gravid, EFW 9# Ex:  trace edema SVE:  4/70/-2 FHTs:  150s, moderate variability, + accels, Cat 1 Toco:  q5 min   A/P   32 y.o. G1P0000 [redacted]w[redacted]d presents with gestational hypertension for IOL IOL--cervix favorable, will start pitocin GHTN--labs wnl on admission, will monitor for signs / symptoms of severe preeclampsia EFW 89%, 4000 gm.  Does not meet threshold for discussion of primary cesarean delivery in setting of GDM.  Will monitor labor curve closely History of heroin abuse: UDS on admission FSR/ vtx/ GBS neg  Jessie Cowher GEFFEL Itai Barbian

## 2018-03-02 ENCOUNTER — Inpatient Hospital Stay (HOSPITAL_COMMUNITY): Payer: Medicaid Other | Admitting: Anesthesiology

## 2018-03-02 ENCOUNTER — Encounter (HOSPITAL_COMMUNITY)
Admission: AD | Disposition: A | Discharge status: Home / Self Care | Payer: Self-pay | Source: Home / Self Care | Attending: Obstetrics

## 2018-03-02 ENCOUNTER — Encounter (HOSPITAL_COMMUNITY): Payer: Self-pay | Admitting: Anesthesiology

## 2018-03-02 LAB — CBC
HEMATOCRIT: 38.1 % (ref 36.0–46.0)
Hemoglobin: 13.3 g/dL (ref 12.0–15.0)
MCH: 32.3 pg (ref 26.0–34.0)
MCHC: 34.9 g/dL (ref 30.0–36.0)
MCV: 92.5 fL (ref 80.0–100.0)
Platelets: 184 10*3/uL (ref 150–400)
RBC: 4.12 MIL/uL (ref 3.87–5.11)
RDW: 13.8 % (ref 11.5–15.5)
WBC: 13 10*3/uL — AB (ref 4.0–10.5)
nRBC: 0 % (ref 0.0–0.2)

## 2018-03-02 LAB — GLUCOSE, CAPILLARY
GLUCOSE-CAPILLARY: 98 mg/dL (ref 70–99)
Glucose-Capillary: 122 mg/dL — ABNORMAL HIGH (ref 70–99)
Glucose-Capillary: 59 mg/dL — ABNORMAL LOW (ref 70–99)
Glucose-Capillary: 65 mg/dL — ABNORMAL LOW (ref 70–99)
Glucose-Capillary: 72 mg/dL (ref 70–99)
Glucose-Capillary: 79 mg/dL (ref 70–99)
Glucose-Capillary: 87 mg/dL (ref 70–99)

## 2018-03-02 LAB — RPR: RPR Ser Ql: NONREACTIVE

## 2018-03-02 SURGERY — Surgical Case
Anesthesia: Epidural

## 2018-03-02 MED ORDER — NALBUPHINE HCL 10 MG/ML IJ SOLN: 5.0000 mg | INTRAMUSCULAR | Status: DC | PRN

## 2018-03-02 MED ORDER — ACETAMINOPHEN 325 MG PO TABS: 650.0000 mg | ORAL_TABLET | ORAL | Status: DC | PRN

## 2018-03-02 MED ORDER — FENTANYL CITRATE (PF) 100 MCG/2ML IJ SOLN: 25.0000 ug | INTRAMUSCULAR | Status: DC | PRN

## 2018-03-02 MED ORDER — PRENATAL MULTIVITAMIN CH
1.0000 | ORAL_TABLET | Freq: Every day | ORAL | Status: DC
  Administered 2018-03-03 – 2018-03-04 (×2): 1 via ORAL
  Filled 2018-03-02 (×2): qty 1

## 2018-03-02 MED ORDER — TETANUS-DIPHTH-ACELL PERTUSSIS 5-2.5-18.5 LF-MCG/0.5 IM SUSP: 0.5000 mL | Freq: Once | INTRAMUSCULAR | Status: DC

## 2018-03-02 MED ORDER — LACTATED RINGERS IV SOLN
INTRAVENOUS | Status: DC
  Administered 2018-03-02 – 2018-03-03 (×2): via INTRAVENOUS

## 2018-03-02 MED ORDER — OXYCODONE HCL 5 MG PO TABS: 10.0000 mg | ORAL_TABLET | ORAL | Status: DC | PRN

## 2018-03-02 MED ORDER — KETOROLAC TROMETHAMINE 30 MG/ML IJ SOLN: 30.0000 mg | Freq: Four times a day (QID) | INTRAMUSCULAR | Status: AC | PRN

## 2018-03-02 MED ORDER — SODIUM CHLORIDE 0.9 % IV SOLN
500.0000 mg | Freq: Once | INTRAVENOUS | Status: AC
  Administered 2018-03-02: 500 mg via INTRAVENOUS
  Filled 2018-03-02: qty 500

## 2018-03-02 MED ORDER — LACTATED RINGERS IV SOLN
500.0000 mL | Freq: Once | INTRAVENOUS | Status: AC
  Administered 2018-03-02: 500 mL via INTRAVENOUS

## 2018-03-02 MED ORDER — NALOXONE HCL 0.4 MG/ML IJ SOLN: 0.4000 mg | INTRAMUSCULAR | Status: DC | PRN

## 2018-03-02 MED ORDER — FENTANYL 2.5 MCG/ML BUPIVACAINE 1/10 % EPIDURAL INFUSION (WH - ANES)
14.0000 mL/h | INTRAMUSCULAR | Status: DC | PRN
  Administered 2018-03-02 (×2): 14 mL/h via EPIDURAL
  Filled 2018-03-02 (×2): qty 100

## 2018-03-02 MED ORDER — MEPERIDINE HCL 25 MG/ML IJ SOLN
6.2500 mg | INTRAMUSCULAR | Status: DC | PRN
  Administered 2018-03-02: 12.5 mg via INTRAVENOUS

## 2018-03-02 MED ORDER — LIDOCAINE-EPINEPHRINE (PF) 2 %-1:200000 IJ SOLN
INTRAMUSCULAR | Status: AC
  Filled 2018-03-02: qty 20

## 2018-03-02 MED ORDER — OXYTOCIN 10 UNIT/ML IJ SOLN
INTRAMUSCULAR | Status: AC
  Filled 2018-03-02: qty 4

## 2018-03-02 MED ORDER — METOCLOPRAMIDE HCL 5 MG/ML IJ SOLN: 10.0000 mg | Freq: Once | INTRAMUSCULAR | Status: DC | PRN

## 2018-03-02 MED ORDER — KETOROLAC TROMETHAMINE 30 MG/ML IJ SOLN
INTRAMUSCULAR | Status: AC
  Filled 2018-03-02: qty 1

## 2018-03-02 MED ORDER — DIBUCAINE 1 % RE OINT: 1.0000 "application " | TOPICAL_OINTMENT | RECTAL | Status: DC | PRN

## 2018-03-02 MED ORDER — SENNOSIDES-DOCUSATE SODIUM 8.6-50 MG PO TABS
2.0000 | ORAL_TABLET | ORAL | Status: DC
  Administered 2018-03-02 – 2018-03-04 (×3): 2 via ORAL
  Filled 2018-03-02 (×3): qty 2

## 2018-03-02 MED ORDER — PHENYLEPHRINE 40 MCG/ML (10ML) SYRINGE FOR IV PUSH (FOR BLOOD PRESSURE SUPPORT)
80.0000 ug | PREFILLED_SYRINGE | INTRAVENOUS | Status: DC | PRN
  Filled 2018-03-02: qty 10

## 2018-03-02 MED ORDER — MORPHINE SULFATE (PF) 0.5 MG/ML IJ SOLN
INTRAMUSCULAR | Status: DC | PRN
  Administered 2018-03-02: 3 mg via EPIDURAL

## 2018-03-02 MED ORDER — COCONUT OIL OIL: 1.0000 "application " | TOPICAL_OIL | Status: DC | PRN

## 2018-03-02 MED ORDER — SODIUM CHLORIDE 0.9% FLUSH: 3.0000 mL | INTRAVENOUS | Status: DC | PRN

## 2018-03-02 MED ORDER — OXYTOCIN 40 UNITS IN LACTATED RINGERS INFUSION - SIMPLE MED: 2.5000 [IU]/h | INTRAVENOUS | Status: AC

## 2018-03-02 MED ORDER — LACTATED RINGERS IV SOLN: INTRAVENOUS | Status: DC

## 2018-03-02 MED ORDER — BUPIVACAINE HCL (PF) 0.25 % IJ SOLN
INTRAMUSCULAR | Status: DC | PRN
  Administered 2018-03-02 (×2): 4 mL via EPIDURAL

## 2018-03-02 MED ORDER — EPHEDRINE 5 MG/ML INJ: 10.0000 mg | INTRAVENOUS | Status: DC | PRN

## 2018-03-02 MED ORDER — MORPHINE SULFATE (PF) 0.5 MG/ML IJ SOLN
INTRAMUSCULAR | Status: AC
  Filled 2018-03-02: qty 10

## 2018-03-02 MED ORDER — IBUPROFEN 600 MG PO TABS
600.0000 mg | ORAL_TABLET | Freq: Four times a day (QID) | ORAL | Status: DC
  Administered 2018-03-02 – 2018-03-05 (×10): 600 mg via ORAL
  Filled 2018-03-02 (×10): qty 1

## 2018-03-02 MED ORDER — PHENYLEPHRINE 40 MCG/ML (10ML) SYRINGE FOR IV PUSH (FOR BLOOD PRESSURE SUPPORT)
PREFILLED_SYRINGE | INTRAVENOUS | Status: DC | PRN
  Administered 2018-03-02: 80 ug via INTRAVENOUS
  Administered 2018-03-02: 120 ug via INTRAVENOUS
  Administered 2018-03-02: 80 ug via INTRAVENOUS

## 2018-03-02 MED ORDER — DIPHENHYDRAMINE HCL 50 MG/ML IJ SOLN: 12.5000 mg | INTRAMUSCULAR | Status: DC | PRN

## 2018-03-02 MED ORDER — LIDOCAINE HCL (PF) 1 % IJ SOLN
INTRAMUSCULAR | Status: DC | PRN
  Administered 2018-03-02: 7 mL via EPIDURAL
  Administered 2018-03-02: 5 mL via EPIDURAL

## 2018-03-02 MED ORDER — SIMETHICONE 80 MG PO CHEW
80.0000 mg | CHEWABLE_TABLET | Freq: Three times a day (TID) | ORAL | Status: DC
  Administered 2018-03-03 – 2018-03-04 (×6): 80 mg via ORAL
  Filled 2018-03-02 (×7): qty 1

## 2018-03-02 MED ORDER — ONDANSETRON HCL 4 MG/2ML IJ SOLN
INTRAMUSCULAR | Status: DC | PRN
  Administered 2018-03-02: 4 mg via INTRAVENOUS

## 2018-03-02 MED ORDER — SIMETHICONE 80 MG PO CHEW: 80.0000 mg | CHEWABLE_TABLET | ORAL | Status: DC | PRN

## 2018-03-02 MED ORDER — MEPERIDINE HCL 25 MG/ML IJ SOLN
INTRAMUSCULAR | Status: AC
  Filled 2018-03-02: qty 1

## 2018-03-02 MED ORDER — ONDANSETRON HCL 4 MG/2ML IJ SOLN: 4.0000 mg | Freq: Three times a day (TID) | INTRAMUSCULAR | Status: DC | PRN

## 2018-03-02 MED ORDER — SCOPOLAMINE 1 MG/3DAYS TD PT72
1.0000 | MEDICATED_PATCH | Freq: Once | TRANSDERMAL | Status: DC
  Filled 2018-03-02: qty 1

## 2018-03-02 MED ORDER — SODIUM BICARBONATE 8.4 % IV SOLN
INTRAVENOUS | Status: AC
  Filled 2018-03-02: qty 50

## 2018-03-02 MED ORDER — NALBUPHINE HCL 10 MG/ML IJ SOLN: 5.0000 mg | Freq: Once | INTRAMUSCULAR | Status: DC | PRN

## 2018-03-02 MED ORDER — NALOXONE HCL 4 MG/10ML IJ SOLN
1.0000 ug/kg/h | INTRAVENOUS | Status: DC | PRN
  Filled 2018-03-02: qty 5

## 2018-03-02 MED ORDER — KETOROLAC TROMETHAMINE 30 MG/ML IJ SOLN
30.0000 mg | Freq: Four times a day (QID) | INTRAMUSCULAR | Status: AC | PRN
  Administered 2018-03-02: 30 mg via INTRAMUSCULAR

## 2018-03-02 MED ORDER — OXYTOCIN 10 UNIT/ML IJ SOLN
INTRAVENOUS | Status: DC | PRN
  Administered 2018-03-02: 40 [IU] via INTRAVENOUS

## 2018-03-02 MED ORDER — SODIUM BICARBONATE 8.4 % IV SOLN
INTRAVENOUS | Status: DC | PRN
  Administered 2018-03-02 (×2): 5 mL via EPIDURAL
  Administered 2018-03-02: 10 mL via EPIDURAL

## 2018-03-02 MED ORDER — ONDANSETRON HCL 4 MG/2ML IJ SOLN
INTRAMUSCULAR | Status: AC
  Filled 2018-03-02: qty 2

## 2018-03-02 MED ORDER — CEFAZOLIN SODIUM-DEXTROSE 2-4 GM/100ML-% IV SOLN
2.0000 g | Freq: Once | INTRAVENOUS | Status: AC
  Administered 2018-03-02: 2 g via INTRAVENOUS
  Filled 2018-03-02: qty 100

## 2018-03-02 MED ORDER — DIPHENHYDRAMINE HCL 25 MG PO CAPS: 25.0000 mg | ORAL_CAPSULE | ORAL | Status: DC | PRN

## 2018-03-02 MED ORDER — FENTANYL CITRATE (PF) 100 MCG/2ML IJ SOLN
INTRAMUSCULAR | Status: AC
  Filled 2018-03-02: qty 2

## 2018-03-02 MED ORDER — FENTANYL CITRATE (PF) 100 MCG/2ML IJ SOLN
INTRAMUSCULAR | Status: DC | PRN
  Administered 2018-03-02 (×2): 50 ug via EPIDURAL

## 2018-03-02 MED ORDER — SIMETHICONE 80 MG PO CHEW
80.0000 mg | CHEWABLE_TABLET | ORAL | Status: DC
  Administered 2018-03-03 – 2018-03-05 (×3): 80 mg via ORAL
  Filled 2018-03-02 (×3): qty 1

## 2018-03-02 MED ORDER — DIPHENHYDRAMINE HCL 25 MG PO CAPS: 25.0000 mg | ORAL_CAPSULE | Freq: Four times a day (QID) | ORAL | Status: DC | PRN

## 2018-03-02 MED ORDER — OXYCODONE HCL 5 MG PO TABS
5.0000 mg | ORAL_TABLET | ORAL | Status: DC | PRN
  Administered 2018-03-03 – 2018-03-05 (×12): 5 mg via ORAL
  Filled 2018-03-02 (×12): qty 1

## 2018-03-02 MED ORDER — SODIUM CHLORIDE 0.9 % IR SOLN
Status: DC | PRN
  Administered 2018-03-02: 1

## 2018-03-02 MED ORDER — MENTHOL 3 MG MT LOZG: 1.0000 | LOZENGE | OROMUCOSAL | Status: DC | PRN

## 2018-03-02 MED ORDER — ACETAMINOPHEN 500 MG PO TABS
1000.0000 mg | ORAL_TABLET | Freq: Four times a day (QID) | ORAL | Status: AC
  Administered 2018-03-02 – 2018-03-03 (×3): 1000 mg via ORAL
  Filled 2018-03-02 (×3): qty 2

## 2018-03-02 MED ORDER — WITCH HAZEL-GLYCERIN EX PADS: 1.0000 "application " | MEDICATED_PAD | CUTANEOUS | Status: DC | PRN

## 2018-03-02 MED ORDER — PHENYLEPHRINE 40 MCG/ML (10ML) SYRINGE FOR IV PUSH (FOR BLOOD PRESSURE SUPPORT): 80.0000 ug | PREFILLED_SYRINGE | INTRAVENOUS | Status: DC | PRN

## 2018-03-02 SURGICAL SUPPLY — 36 items
BENZOIN TINCTURE PRP APPL 2/3 (GAUZE/BANDAGES/DRESSINGS) ×3 IMPLANT
CHLORAPREP W/TINT 26ML (MISCELLANEOUS) ×3 IMPLANT
CLAMP CORD UMBIL (MISCELLANEOUS) IMPLANT
CLOSURE WOUND 1/2 X4 (GAUZE/BANDAGES/DRESSINGS) ×1
CLOTH BEACON ORANGE TIMEOUT ST (SAFETY) ×3 IMPLANT
DRSG OPSITE POSTOP 4X10 (GAUZE/BANDAGES/DRESSINGS) ×3 IMPLANT
ELECT REM PT RETURN 9FT ADLT (ELECTROSURGICAL) ×3
ELECTRODE REM PT RTRN 9FT ADLT (ELECTROSURGICAL) ×1 IMPLANT
EXTRACTOR VACUUM KIWI (MISCELLANEOUS) IMPLANT
GLOVE BIOGEL PI IND STRL 6.5 (GLOVE) ×1 IMPLANT
GLOVE BIOGEL PI IND STRL 7.0 (GLOVE) ×1 IMPLANT
GLOVE BIOGEL PI INDICATOR 6.5 (GLOVE) ×2
GLOVE BIOGEL PI INDICATOR 7.0 (GLOVE) ×2
GLOVE ECLIPSE 6.0 STRL STRAW (GLOVE) ×3 IMPLANT
GOWN STRL REUS W/TWL LRG LVL3 (GOWN DISPOSABLE) ×6 IMPLANT
KIT ABG SYR 3ML LUER SLIP (SYRINGE) IMPLANT
NEEDLE HYPO 25X5/8 SAFETYGLIDE (NEEDLE) IMPLANT
NS IRRIG 1000ML POUR BTL (IV SOLUTION) ×3 IMPLANT
PACK C SECTION WH (CUSTOM PROCEDURE TRAY) ×3 IMPLANT
PAD OB MATERNITY 4.3X12.25 (PERSONAL CARE ITEMS) ×3 IMPLANT
PENCIL SMOKE EVAC W/HOLSTER (ELECTROSURGICAL) ×3 IMPLANT
RTRCTR C-SECT PINK 25CM LRG (MISCELLANEOUS) ×3 IMPLANT
SPONGE LAP 18X18 RF (DISPOSABLE) ×9 IMPLANT
STRIP CLOSURE SKIN 1/2X4 (GAUZE/BANDAGES/DRESSINGS) ×2 IMPLANT
SUT MNCRL 0 VIOLET CTX 36 (SUTURE) ×2 IMPLANT
SUT MNCRL AB 3-0 PS2 27 (SUTURE) ×3 IMPLANT
SUT MONOCRYL 0 CTX 36 (SUTURE) ×4
SUT PLAIN 0 NONE (SUTURE) IMPLANT
SUT PLAIN 2 0 (SUTURE) ×2
SUT PLAIN ABS 2-0 CT1 27XMFL (SUTURE) ×1 IMPLANT
SUT VIC AB 0 CTX 36 (SUTURE) ×4
SUT VIC AB 0 CTX36XBRD ANBCTRL (SUTURE) ×2 IMPLANT
SUT VIC AB 2-0 CT1 27 (SUTURE) ×2
SUT VIC AB 2-0 CT1 TAPERPNT 27 (SUTURE) ×1 IMPLANT
TOWEL OR 17X24 6PK STRL BLUE (TOWEL DISPOSABLE) ×3 IMPLANT
TRAY FOLEY W/BAG SLVR 14FR LF (SET/KITS/TRAYS/PACK) ×3 IMPLANT

## 2018-03-02 NOTE — Anesthesia Procedure Notes (Signed)
Epidural Patient location during procedure: OB Start time: 03/02/2018 2:55 AM End time: 03/02/2018 2:58 AM  Staffing Anesthesiologist: Leilani Able, MD  Preanesthetic Checklist Completed: patient identified, site marked, surgical consent, pre-op evaluation, timeout performed, IV checked, risks and benefits discussed and monitors and equipment checked  Epidural Patient position: sitting Prep: site prepped and draped and DuraPrep Patient monitoring: continuous pulse ox and blood pressure Approach: midline Location: L3-L4 Injection technique: LOR air  Needle:  Needle type: Tuohy  Needle gauge: 17 G Needle length: 9 cm and 9 Needle insertion depth: 6 cm Catheter type: closed end flexible Catheter size: 19 Gauge Catheter at skin depth: 11 cm Test dose: negative and Other  Assessment Sensory level: T9 Events: blood not aspirated, injection not painful, no injection resistance, negative IV test and no paresthesia  Additional Notes Reason for block:procedure for pain

## 2018-03-02 NOTE — Anesthesia Preprocedure Evaluation (Addendum)
Anesthesia Evaluation  Patient identified by MRN, date of birth, ID band Patient awake    Reviewed: Allergy & Precautions, H&P , NPO status , Patient's Chart, lab work & pertinent test results  Airway Mallampati: I  TM Distance: >3 FB Neck ROM: full    Dental no notable dental hx. (+) Teeth Intact   Pulmonary Current Smoker,    Pulmonary exam normal breath sounds clear to auscultation       Cardiovascular hypertension, Normal cardiovascular exam Rhythm:regular Rate:Normal     Neuro/Psych negative neurological ROS     GI/Hepatic negative GI ROS, (+)     substance abuse (off methadone now)  ,   Endo/Other  negative endocrine ROS  Renal/GU negative Renal ROS     Musculoskeletal  (+) narcotic dependent  Abdominal Normal abdominal exam  (+)   Peds  Hematology negative hematology ROS (+)   Anesthesia Other Findings   Reproductive/Obstetrics (+) Pregnancy                            Anesthesia Physical Anesthesia Plan  ASA: II  Anesthesia Plan: Epidural   Post-op Pain Management:    Induction:   PONV Risk Score and Plan:   Airway Management Planned:   Additional Equipment:   Intra-op Plan:   Post-operative Plan:   Informed Consent: I have reviewed the patients History and Physical, chart, labs and discussed the procedure including the risks, benefits and alternatives for the proposed anesthesia with the patient or authorized representative who has indicated his/her understanding and acceptance.     Plan Discussed with:   Anesthesia Plan Comments:         Anesthesia Quick Evaluation

## 2018-03-02 NOTE — Progress Notes (Signed)
Pt blood glucose 59 at 0040. Pt stated that she just had 2 [place] ices as she was feeling like her blood sugar was low.

## 2018-03-02 NOTE — Transfer of Care (Signed)
Immediate Anesthesia Transfer of Care Note  Patient: Brandy Zuniga  Procedure(s) Performed: CESAREAN SECTION (N/A )  Patient Location: PACU  Anesthesia Type:Epidural  Level of Consciousness: awake  Airway & Oxygen Therapy: Patient Spontanous Breathing  Post-op Assessment: Report given to RN  Post vital signs: Reviewed and stable  Last Vitals:  Vitals Value Taken Time  BP    Temp    Pulse    Resp    SpO2      Last Pain:  Vitals:   03/02/18 1438  TempSrc: Oral  PainSc:          Complications: No apparent anesthesia complications

## 2018-03-02 NOTE — Progress Notes (Signed)
Pt seen and examined.  Some cramping  BP 115/75   Pulse 67   Temp 98.7 F (37.1 C) (Oral)   Resp 17   Ht 5\' 3"  (1.6 m)   Wt 78 kg   BMI 30.47 kg/m  Toco: q3 min EFM:130s, moderate variability, + accels, no decels SVE: 4/70/-3, AROM copious clear fluid   G1 @ [redacted]w[redacted]d w IOL for ghtn BPs stable in mild range Continue IOL with pitocin Epidural upon request

## 2018-03-02 NOTE — Anesthesia Pain Management Evaluation Note (Signed)
  CRNA Pain Management Visit Note  Patient: Brandy Zuniga, 32 y.o., female  "Hello I am a member of the anesthesia team at [facility]. We have an anesthesia team available at all times to provide care throughout the hospital, including epidural management and anesthesia for C-section. I don't know your plan for the delivery whether it a natural birth, water birth, IV sedation, nitrous supplementation, doula or epidural, but we want to meet your pain goals."   1.Was your pain managed to your expectations on prior hospitalizations?   No prior hospitalizations  2.What is your expectation for pain management during this hospitalization?     Epidural  3.How can we help you reach that goal? unsure  Record the patient's initial score and the patient's pain goal.   Pain: 0  Pain Goal: 10 [facility] wants you to be able to say your pain was always managed very well.  Cephus Shelling 03/02/2018

## 2018-03-02 NOTE — Lactation Note (Signed)
This note was copied from a baby's chart. 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 Per parents infant had 1 soiled diaper since birth at 7 hours of life. BF concerns: Mom has abrasion on right breast at 7 hours since delivery, short shaft and flat nipple on  right breast.  Per mom, did not attend any BF classes in her pregnancy. Given a hand pump ( harmony) by nurse due left breast being short shaft and semi flat when compressed. Per mom, BF infant almost 1 hour at 9:30 pm infant is not cuing or interested in feeding at this time. LC notice mom has nipple trauma on right breast abrasion and sores. LC gave mom comfort gels and explained how to use and breast shells to help evert and elongate nipple shaft for left breast. Mom knows how to pre-pump to help extended nipple shaft of left breast before latching infant to breast. LC discussed hunger cues, BF 8 to 12 times within 24 hours including nights. LC discussed I & O. Reviewed Baby & Me book's Breastfeeding Basics.  Mom made aware of O/P services, breastfeeding support groups, community resources, and our phone # for post-discharge questions.  Mom's current plan: 1. Mom will ask  for assistance with latching infant to breast  from Nurse or LC at next feeding. 2. Mom will wear breast shells and pre-pump before latching infant to breast on left breast to help  extend nipple shaft out more. 3. Mom use comfort gels to help with breast soreness on right breast. 4. Mom will BF according hunger cues, 8 to 12 times within 24 hours including nights.  Maternal  Data Formula Feeding for Exclusion: No Has patient been taught Hand Expression?: Yes(Mom has colostrum present.) Does the patient have breastfeeding experience prior to this delivery?:  No  Feeding Feeding Type: Breast Fed  LATCH Score Latch: Grasps breast easily, tongue down, lips flanged, rhythmical sucking.  Audible Swallowing: A few with stimulation  Type of Nipple: Everted at rest and after stimulation(L nipple flat)  Comfort (Breast/Nipple): Soft / non-tender  Hold (Positioning): No assistance needed to correctly position infant at breast.  LATCH Score: 9  Interventions Interventions: Breast feeding basics reviewed;Hand express;Pre-pump if needed  Lactation Tools Discussed/Used Tools: Shells(Given by [facility] WIC Program: No Pump Review: Setup, frequency, and cleaning;Milk Storage Initiated by:: done by Nurse Date initiated:: 03/01/18   Consult Status Consult Status: Follow-up Date: 03/02/18 Follow-up type: In-patient    Brandy Zuniga 03/02/2018, 11:36 PM

## 2018-03-02 NOTE — Op Note (Signed)
Cesarean Section Procedure Note  Pre-operative Diagnosis: 1. Intrauterine pregnancy at [redacted]w[redacted]d  2. Arrest of descent  3. Non-reassuring fetal status  Post-operative Diagnosis: same as above  Surgeon: Marlow Baars, MD  Assistants: Carmelina Noun   Procedure: Primary low transverse cesarean section   Anesthesia: Epidural anesthesia  Estimated Blood Loss: 320 mL         Drains: Foley catheter         Specimens: placenta to L&D              Complications:  None; patient tolerated the procedure well.         Disposition: PACU - hemodynamically stable.  Findings:  Normal uterus, tubes and ovaries bilaterally.  Viable female infant, 3515g (7lb 12oz) Apgars 3, 8.    Procedure Details   After epidural anesthesia was found to adequate, the patient was placed in the dorsal supine position with a leftward tilt, prepped and draped in the usual sterile manner. A Pfannenstiel incision was made and carried down through the subcutaneous tissue to the fascia.  The fascia was incised in the midline and the fascial incision was extended laterally with Mayo scissors. The superior aspect of the fascial incision was grasped with two Kocher clamps, tented up and the rectus muscles dissected off sharply. The rectus was then dissected off with blunt dissection and Mayo scissors inferiorly. The rectus muscles were separated in the midline. The abdominal peritoneum was identified, tented up, entered bluntly, and the incision was extended superiorly and inferiorly with good visualization of the bladder. The Alexis retractor was deployed. The vesicouterine peritoneum was identified, tented up, entered sharply, and the bladder flap was created digitally. A scalpel was then used to make a low transverse incision on the uterus which was extended in the cephalad-caudad direction with blunt dissection. The fluid was stained with thick meconium. The fetal vertex was identified wedged deep in the pelvis.  It was elevated out of the  pelvis and brought to the hysterotomy.  The head was delivered easily followed by the shoulders and body.  After a 30 second delay, the cord was clamped and cut and the infant was passed to the waiting neonatologist.  The placenta was then delivered spontaneously, intact and appear normal, the uterus was cleared of all clot and debris   The hysterotomy was repaired with #0 Monocryl in running locked fashion.  A second imbricating layer of #0 Monocryl was placed.  Uterine tone was excellent.  The Alexis retractor was removed from the abdomen. The peritoneum was examined and all vessels noted to be hemostatic. The abdominal cavity was cleared of all clot and debris.  The peritoneum was closed with 2-0 vicryl in a running fashion.  The rectus muscles were then closed with 2-0 Vicryl. The fascia and rectus muscles were inspected and were hemostatic. The fascia was closed with 0 Vicryl in a running fashion. The subcutaneous layer was irrigated and all bleeders cauterized. The subcutaneous layer was closed with interrupted plain gut. The skin was closed with 3-0 monocryl in a subcuticular fashion. The incision was dressed with benzoine, steri strips and honeycomb dressing. All sponge lap and needle counts were correct x3. Patient tolerated the procedure well and recovered in stable condition following the procedure.

## 2018-03-02 NOTE — Progress Notes (Signed)
Patient pushing well for almost 2.5 hours with excellent effort.  There has been minimal fetal descent and worsening caput.  At this time, the majority of the fetal head is still behind her narrow anterior pubic arch.  In addition, over the last 30-45 minutes, the baseline has increased to the 150s, variability is minimal and there are late decelerations following pushing that did not resolve with position change.  EFM return to category 1 with discontinuation of pitocin and cessation of pushing.   Given GDMA1, polyhydramnios, LGA baby and now recurrent decelerations with pushing, I recommend that we proceed to the operating room for arrest of descent.    We discussed the risks to cesarean section to include infection, bleeding, damage to surrounding structures (including but not limited to bowel, bladder, tubes, ovaries, nerves, vessels, baby), need for blood transfusion, venous thromboembolism, need for additional procedures.  All questions answered and consent signed.

## 2018-03-02 NOTE — Consult Note (Signed)
[facility] [place]  Delivery Note:  C-section       03/02/2018  3:31 PM  I was called to the operating room at the request of the patient's obstetrician (Dr. Chestine Spore) for a primary c-section for failure to progress.  PRENATAL HX:  This is a 32 y/o G1P0 at 71 and 6/[redacted] weeks gestation who was admitted for IOL due to gestational hypertension.  Her pregnancy has also been complicated by A1 GDM and polyhydramnios.  History of heroin use but not during pregnancy.  She is GBS negative with ROM x14 hours. C-section for failure to progress.    DELIVERY:  Infant was not vigorous at delivery so cord clamping was not delayed.  Infant apneic with HR < 60 despite standard warming, drying and stimulation.  PPV initiated at 1 minute of age and after 30 seconds HR improved to 150s.  O2 saturations appropriate thereafter without any respiratory support and were in upper 90s by 10 minutes of age. APGARs 3 and 8.  Exam within normal limits.  After 10 minutes, baby left with nurse to assist parents with skin-to-skin care.   _____________________ Electronically Signed By: Maryan Char, MD Neonatologist

## 2018-03-02 NOTE — Progress Notes (Signed)
Contractions now q3 to 4 minutes.  Baseline 140s, moderate variability, no decelerations, Category 1 tracing.  Will start pushing.

## 2018-03-02 NOTE — Progress Notes (Signed)
Patient 10/100/+2.  Contractions have spaced to q7 to 10 minutes.  Patient pushed will good effort, but it will be inefficient to push so infrequently, IUPC was placed to better trace contractions.  Will titrate pitocin over the next 1-2 hours to a more regular and frequent contraction pattern and then resume pushing. Fetal tracing is category 2 due to intermittent variable decelerations with contractions, but is overall reassuring with moderate variability and scalp stimulation with exam.   Will resume pushing when contraction more frequent.

## 2018-03-03 LAB — CBC
HCT: 29.2 % — ABNORMAL LOW (ref 36.0–46.0)
Hemoglobin: 9.9 g/dL — ABNORMAL LOW (ref 12.0–15.0)
MCH: 31.9 pg (ref 26.0–34.0)
MCHC: 33.9 g/dL (ref 30.0–36.0)
MCV: 94.2 fL (ref 80.0–100.0)
PLATELETS: 143 10*3/uL — AB (ref 150–400)
RBC: 3.1 MIL/uL — ABNORMAL LOW (ref 3.87–5.11)
RDW: 14.2 % (ref 11.5–15.5)
WBC: 14 10*3/uL — ABNORMAL HIGH (ref 4.0–10.5)
nRBC: 0 % (ref 0.0–0.2)

## 2018-03-03 MED ORDER — ACETAMINOPHEN 500 MG PO TABS
1000.0000 mg | ORAL_TABLET | Freq: Four times a day (QID) | ORAL | Status: DC
  Administered 2018-03-03 – 2018-03-05 (×6): 1000 mg via ORAL
  Filled 2018-03-03 (×7): qty 2

## 2018-03-03 MED ORDER — OXYCODONE HCL 5 MG PO TABS
5.0000 mg | ORAL_TABLET | Freq: Once | ORAL | Status: AC
  Administered 2018-03-03: 5 mg via ORAL
  Filled 2018-03-03: qty 1

## 2018-03-03 NOTE — Progress Notes (Signed)
Patient is doing well.  She is tolerating PO, ambulating.  Foley catheter was just removed, but has yet to void.  Pain is controlled.  Lochia is appropriate  Vitals:   03/02/18 2142 03/03/18 0125 03/03/18 0616 03/03/18 0621  BP: 124/79 (!) 104/59 105/67 116/63  Pulse: 79 66 64 74  Resp: 20     Temp: 97.9 F (36.6 C) 98.4 F (36.9 C) 98.2 F (36.8 C)   TempSrc: Oral Oral Oral   SpO2: 99% 97% 99% 100%  Weight:      Height:        NAD Abdomen:  soft, appropriate tenderness, incisions intact and without erythema or drainage ext:    Symmetric, 1+ edema bilaterally  Lab Results  Component Value Date   WBC 14.0 (H) 03/03/2018   HGB 9.9 (L) 03/03/2018   HCT 29.2 (L) 03/03/2018   MCV 94.2 03/03/2018   PLT 143 (L) 03/03/2018    --/--/B POS, B POS Performed at [facility], [address], [place], [place] [address]  (11/01 1620)/RImmune  A/P    32 y.o. G1P1001 POD 1 s/p primary CD for arrest of descent Routine post op and postpartum care.   H/o heroin abuse--discussed goals for pain management--optimize non-narcotic meds first, minimize narcotic dose / frequency.  GDMA1

## 2018-03-03 NOTE — Anesthesia Postprocedure Evaluation (Signed)
Anesthesia Post Note  Patient: Brandy Zuniga  Procedure(s) Performed: CESAREAN SECTION (N/A )     Patient location during evaluation: Mother Baby Anesthesia Type: Epidural Level of consciousness: awake and alert, oriented and patient cooperative Pain management: pain level controlled Vital Signs Assessment: post-procedure vital signs reviewed and stable Respiratory status: spontaneous breathing Cardiovascular status: stable Postop Assessment: no headache, epidural receding, patient able to bend at knees and no signs of nausea or vomiting Anesthetic complications: no Comments: Pain score 6.  Pt encouraged to requests pain prn.    Last Vitals:  Vitals:   03/03/18 0616 03/03/18 0621  BP: 105/67 116/63  Pulse: 64 74  Resp:    Temp: 36.8 C   SpO2: 99% 100%    Last Pain:  Vitals:   03/03/18 0616  TempSrc: Oral  PainSc:    Pain Goal: Patients Stated Pain Goal: 3 (03/02/18 1840)               Merrilyn Puma

## 2018-03-05 LAB — GLUCOSE, CAPILLARY: Glucose-Capillary: 75 mg/dL (ref 70–99)

## 2018-03-05 MED ORDER — OXYCODONE HCL 5 MG PO TABS: 5.0000 mg | ORAL_TABLET | ORAL | 0 refills | Status: DC | PRN

## 2018-03-05 MED ORDER — INFLUENZA VAC SPLIT QUAD 0.5 ML IM SUSY
0.5000 mL | PREFILLED_SYRINGE | INTRAMUSCULAR | Status: AC
  Administered 2018-03-05: 0.5 mL via INTRAMUSCULAR
  Filled 2018-03-05: qty 0.5

## 2018-03-05 NOTE — Discharge Summary (Signed)
Obstetric Discharge Summary Reason for Admission: induction of labor Prenatal Procedures: NST and ultrasound Intrapartum Procedures: cesarean: low cervical, transverse Postpartum Procedures: none Complications-Operative and Postpartum: none Hemoglobin  Date Value Ref Range Status  03/03/2018 9.9 (L) 12.0 - 15.0 g/dL Final    Comment:    DELTA CHECK NOTED REPEATED TO VERIFY    HCT  Date Value Ref Range Status  03/03/2018 29.2 (L) 36.0 - 46.0 % Final    Physical Exam:  General: alert and cooperative Lochia: appropriate Uterine Fundus: firm   Discharge Diagnoses: Term Pregnancy-delivered, Failed induction and Preelampsia  Discharge Information: Date: 03/05/2018 Activity: pelvic rest Diet: routine Medications: PNV, Iron and Percocet Condition: stable Instructions: refer to practice specific booklet Discharge to: home Follow-up Information    Marlow Baars, MD. Schedule an appointment as soon as possible for a visit in 1 month(s).   Specialty:  Obstetrics Contact information: [address] [place] [address] [phone]           Newborn Data: Live born female  Birth Weight: 7 lb 12 oz (3515 g) APGAR: 3, 8  Newborn Delivery   Birth date/time:  03/02/2018 15:22:00 Delivery type:  C-Section, Low Transverse Trial of labor:  Yes C-section categorization:  Primary     Home with mother.Lainey Nelson E 03/05/2018, 8:59 AM

## 2019-03-31 ENCOUNTER — Emergency Department (HOSPITAL_BASED_OUTPATIENT_CLINIC_OR_DEPARTMENT_OTHER)
Admission: EM | Admit: 2019-03-31 | Discharge: 2019-03-31 | Disposition: A | Discharge status: Home / Self Care | Payer: Medicaid Other | Attending: Emergency Medicine | Admitting: Emergency Medicine

## 2019-03-31 ENCOUNTER — Encounter (HOSPITAL_BASED_OUTPATIENT_CLINIC_OR_DEPARTMENT_OTHER): Payer: Self-pay | Admitting: *Deleted

## 2019-03-31 ENCOUNTER — Other Ambulatory Visit: Payer: Self-pay

## 2019-03-31 DIAGNOSIS — G609 Hereditary and idiopathic neuropathy, unspecified: Secondary | ICD-10-CM | POA: Insufficient documentation

## 2019-03-31 DIAGNOSIS — U071 COVID-19: Secondary | ICD-10-CM | POA: Insufficient documentation

## 2019-03-31 DIAGNOSIS — Z79899 Other long term (current) drug therapy: Secondary | ICD-10-CM | POA: Insufficient documentation

## 2019-03-31 DIAGNOSIS — F17210 Nicotine dependence, cigarettes, uncomplicated: Secondary | ICD-10-CM | POA: Insufficient documentation

## 2019-03-31 LAB — MAGNESIUM: Magnesium: 2.3 mg/dL (ref 1.7–2.4)

## 2019-03-31 LAB — BASIC METABOLIC PANEL
Anion gap: 8 (ref 5–15)
BUN: 14 mg/dL (ref 6–20)
CO2: 25 mmol/L (ref 22–32)
Calcium: 8.9 mg/dL (ref 8.9–10.3)
Chloride: 105 mmol/L (ref 98–111)
Creatinine, Ser: 0.82 mg/dL (ref 0.44–1.00)
GFR calc Af Amer: >60 mL/min (ref >60)
GFR calc non Af Amer: >60 mL/min (ref >60)
Glucose, Bld: 93 mg/dL (ref 70–99)
Potassium: 3.9 mmol/L (ref 3.5–5.1)
Sodium: 138 mmol/L (ref 135–145)

## 2019-03-31 LAB — CK: Total CK: 53 U/L (ref 38–234)

## 2019-03-31 MED ORDER — METHYLPREDNISOLONE 4 MG PO TBPK: ORAL_TABLET | ORAL | 0 refills | Status: DC

## 2019-03-31 MED ORDER — GABAPENTIN 100 MG PO CAPS: 100.0000 mg | ORAL_CAPSULE | Freq: Three times a day (TID) | ORAL | 0 refills | Status: DC

## 2019-03-31 NOTE — ED Provider Notes (Signed)
[facility] EMERGENCY DEPARTMENT Provider Note   CSN: [id number] Arrival date & time: 03/31/19  1030     History   Chief Complaint Chief Complaint  Patient presents with  . Generalized Body Aches    HPI Brandy Zuniga is a 33 y.o. female who tested positive for Covid 7 days ago presenting to the ED with peripheral neuropathy and severe myalgia.  She reports she has had generalized body aches since the onset of her symptoms 7 to 8 days ago.  This is worse in her calfs and her upper arms with cramping pain.  However she is primarily disturbed by pain in her bilateral hands and feet.  She describes exquisite pain with any movement of her arms and feet, feeling her joints are swollen and inflamed.  She says she is had difficulty even walking and feels like she needs to crawl around at home when her pain gets bad.  She has tried Tylenol and Motrin at home for pain with very minimal relief.  She denies any respiratory symptoms or shortness of breath.  She denies any chest pain or fevers.     HPI  Past Medical History:  Diagnosis Date  . Allergic rhinitis   . Anxiety   . Asthma   . 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 History of nephrolithiasis   . 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 Gestational hypertension without significant proteinuria in third trimester 03/01/2018  . Abnormal glucose tolerance test (GTT) during pregnancy, antepartum 01/08/2018  . History of drug dependence (HCC) 02/28/2013  . PTSD (post-traumatic stress disorder) possible 02/28/2013  . NEPHROLITHIASIS, HX OF 10/14/2008  . ANXIETY 07/29/2007  . ALLERGIC RHINITIS 07/29/2007  . Asthma with acute exacerbation 07/29/2007    Past Surgical History:  Procedure Laterality Date  . CESAREAN SECTION N/A 03/02/2018   Procedure: CESAREAN SECTION;  Surgeon: Marlow Baars, MD;  Location: [facility] BIRTHING SUITES;  Service: Obstetrics;  Laterality: N/A;  . NO PAST SURGERIES       OB History    Gravida  1   Para  1   Term  1   Preterm  0   AB  0   Living  1     SAB  0   TAB  0   Ectopic  0   Multiple  0   Live Births  1            Home Medications    Prior to Admission medications   Medication Sig Start Date End Date Taking? Authorizing Provider  gabapentin (NEURONTIN) 100 MG capsule Take 1 capsule (100 mg total) by mouth 3 (three) times daily for 20 days. 03/31/19 04/20/19  Terald Sleeper, MD  methylPREDNISolone (MEDROL DOSEPAK) 4 MG TBPK tablet Use per instructions 03/31/19   Elaria Osias, Kermit Balo, MD  oxyCODONE (OXY IR/ROXICODONE) 5 MG immediate release tablet Take 1 tablet (5 mg total) by mouth every 4 (four) hours as needed (pain scale 4-7). 03/05/18   Levi Aland, MD  Prenatal Vit-Fe Fumarate-FA (PRENATAL MULTIVITAMIN) TABS tablet Take 1 tablet by mouth daily at 12 noon.    [provider]    Family History Family History  Problem Relation Age of Onset  . Hypertension Mother   .  Anxiety disorder Mother   . Hypertension Father     Social History Social History   Tobacco Use  . Smoking status: Current Every Day Smoker    Packs/day: 0.50  . Smokeless tobacco: Never Used  Substance Use Topics  . Alcohol use: No  . Drug use: No     Allergies   Patient has no known allergies.   Review of Systems Review of Systems  Constitutional: Positive for appetite change. Negative for chills and fever.  Respiratory: Negative for cough and shortness of breath.   Cardiovascular: Negative for chest pain and palpitations.  Gastrointestinal: Negative for abdominal pain and vomiting.  Genitourinary: Negative for difficulty urinating and dysuria.  Musculoskeletal: Positive for arthralgias, gait problem, joint swelling and myalgias.  Neurological: Negative for seizures and  syncope.  Psychiatric/Behavioral: Negative for agitation and confusion.  All other systems reviewed and are negative.    Physical Exam Updated Vital Signs BP 115/81 (BP Location: Right Arm)   Pulse 75   Temp 98.7 F (37.1 C) (Oral)   Resp 18   Ht 5\' 3"  (1.6 m)   Wt 66.7 kg   SpO2 99%   BMI 26.04 kg/m   Physical Exam Vitals signs and nursing note reviewed.  Constitutional:      General: She is not in acute distress.    Appearance: She is well-developed.  HENT:     Head: Normocephalic and atraumatic.  Eyes:     Conjunctiva/sclera: Conjunctivae normal.  Neck:     Musculoskeletal: Neck supple.  Cardiovascular:     Rate and Rhythm: Normal rate and regular rhythm.     Pulses: Normal pulses.     Heart sounds: No murmur.  Pulmonary:     Effort: Pulmonary effort is normal. No respiratory distress.     Breath sounds: Normal breath sounds.  Abdominal:     General: There is no distension.     Palpations: Abdomen is soft.     Tenderness: There is no abdominal tenderness.  Musculoskeletal:        General: Tenderness present.  Skin:    General: Skin is warm and dry.  Neurological:     General: No focal deficit present.     Mental Status: She is alert.     Sensory: No sensory deficit.     Motor: No weakness.  Psychiatric:        Mood and Affect: Mood normal.        Behavior: Behavior normal.      ED Treatments / Results  Labs (all labs ordered are listed, but only abnormal results are displayed) Labs Reviewed  BASIC METABOLIC PANEL  CK  MAGNESIUM    EKG None  Radiology No results found.  Procedures Procedures (including critical care time)  Medications Ordered in ED Medications - No data to display   Initial Impression / Assessment and Plan / ED Course  I have reviewed the triage vital signs and the nursing notes.  Pertinent labs & imaging results that were available during my care of the patient were reviewed by me and considered in my medical  decision making (see chart for details).  33 yo female presenting with myalgia and peripheral neuropathy in hands and feet, complaining of pain and burning in these extremities.  No focal weakness on nerve exam.    Plan to check electrolytes, CK level Discussed trial of steroids, gabapentin for these symptoms Suspect they are Covid related and may take some time to resolve  [employer]  was evaluated in Emergency Department on 03/31/2019 for the symptoms described in the history of present illness. She was evaluated in the context of the global COVID-19 pandemic, which necessitated consideration that the patient might be at risk for infection with the SARS-CoV-2 virus that causes COVID-19. Institutional protocols and algorithms that pertain to the evaluation of patients at risk for COVID-19 are in a state of rapid change based on information released by regulatory bodies including the CDC and federal and state organizations. These policies and algorithms were followed during the patient's care in the ED.   Final Clinical Impressions(s) / ED Diagnoses   Final diagnoses:  COVID-19  Idiopathic peripheral neuropathy    ED Discharge Orders         Ordered    methylPREDNISolone (MEDROL DOSEPAK) 4 MG TBPK tablet     03/31/19 1326    gabapentin (NEURONTIN) 100 MG capsule  3 times daily     03/31/19 1326           Terald Sleeperrifan, Vonetta Foulk J, MD 03/31/19 [phone]

## 2019-03-31 NOTE — ED Triage Notes (Signed)
covid positive-tested 7 days ago. Reports body aches continue. No distress.

## 2019-04-01 ENCOUNTER — Telehealth: Payer: Self-pay | Admitting: Internal Medicine

## 2019-04-01 NOTE — Telephone Encounter (Signed)
I see where she was treated yesterday, have her move forward with the medications provided. I'm fine to see her virtually but would recommend she be scheduled at the end of a session.

## 2019-04-01 NOTE — Telephone Encounter (Signed)
Patient has a new patient appointment on 12/18 She tested positive for covid on 25th so her appointment was moved 2 weeks out.  From the 4th to the 18th.  Patient stated she really needs to be seen sooner. She was in the ED and has swelling in her feet and hands to the point she is still in some serious pain. Patient is still having symptoms(body ache)  I know we do not usually do the first visit virtually but is this something you could make an exception for or what do you suggest?

## 2019-04-04 ENCOUNTER — Ambulatory Visit: Payer: Medicaid Other | Admitting: Primary Care

## 2019-04-10 ENCOUNTER — Other Ambulatory Visit: Payer: Self-pay

## 2019-04-10 ENCOUNTER — Encounter: Payer: Self-pay | Admitting: Primary Care

## 2019-04-10 ENCOUNTER — Ambulatory Visit (INDEPENDENT_AMBULATORY_CARE_PROVIDER_SITE_OTHER): Payer: Self-pay | Admitting: Primary Care

## 2019-04-10 VITALS — Temp 98.3°F | Ht 63.0 in | Wt 145.0 lb

## 2019-04-10 DIAGNOSIS — O99810 Abnormal glucose complicating pregnancy: Secondary | ICD-10-CM

## 2019-04-10 DIAGNOSIS — J45909 Unspecified asthma, uncomplicated: Secondary | ICD-10-CM | POA: Insufficient documentation

## 2019-04-10 DIAGNOSIS — J4520 Mild intermittent asthma, uncomplicated: Secondary | ICD-10-CM

## 2019-04-10 DIAGNOSIS — M25572 Pain in left ankle and joints of left foot: Secondary | ICD-10-CM

## 2019-04-10 DIAGNOSIS — M791 Myalgia, unspecified site: Secondary | ICD-10-CM

## 2019-04-10 DIAGNOSIS — M25571 Pain in right ankle and joints of right foot: Secondary | ICD-10-CM

## 2019-04-10 MED ORDER — GABAPENTIN 100 MG PO CAPS: ORAL_CAPSULE | ORAL | 0 refills | Status: DC

## 2019-04-10 NOTE — Assessment & Plan Note (Signed)
A1C pending now.

## 2019-04-10 NOTE — Assessment & Plan Note (Addendum)
Since Covid-19 infection, somewhat improved with gabapentin. Increase dose at bedtime to 200 mg, discussed that she can increase daytime dose if needed.  Also discussed that this will be a temporary prescription until symptoms resolve. She will update.   Will check labs for RA given mother's history and continued joint aches.
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
## 2019-04-10 NOTE — Assessment & Plan Note (Signed)
Diagnosed during childhood, no flares in years. Continue to monitor.

## 2019-04-10 NOTE — Patient Instructions (Signed)
  Please call the main office line to schedule a lab appointment at your convenience. Our phone number is [phone].  We can increase the dose of your gabapentin for pain. Take 1-3 capsules by mouth three times daily.  Please update me as discussed.  It was a pleasure to meet you today! Please don't hesitate to call or message me with any questions. Welcome to [facility]!

## 2019-04-16 ENCOUNTER — Other Ambulatory Visit: Payer: Self-pay

## 2019-04-18 ENCOUNTER — Ambulatory Visit: Payer: Medicaid Other | Admitting: Primary Care

## 2019-04-22 ENCOUNTER — Other Ambulatory Visit: Payer: Self-pay

## 2019-04-22 ENCOUNTER — Other Ambulatory Visit (INDEPENDENT_AMBULATORY_CARE_PROVIDER_SITE_OTHER): Payer: Self-pay

## 2019-04-22 DIAGNOSIS — M25572 Pain in left ankle and joints of left foot: Secondary | ICD-10-CM

## 2019-04-22 DIAGNOSIS — M25571 Pain in right ankle and joints of right foot: Secondary | ICD-10-CM

## 2019-04-22 DIAGNOSIS — M791 Myalgia, unspecified site: Secondary | ICD-10-CM

## 2019-04-22 DIAGNOSIS — O99810 Abnormal glucose complicating pregnancy: Secondary | ICD-10-CM

## 2019-04-22 LAB — HEMOGLOBIN A1C: Hgb A1c MFr Bld: 5.5 % (ref 4.6–6.5)

## 2019-04-22 LAB — C-REACTIVE PROTEIN: CRP: <1 mg/dL (ref 0.5–20.0)

## 2019-04-22 LAB — SEDIMENTATION RATE: Sed Rate: 16 mm/hr (ref 0–20)

## 2019-04-23 LAB — RHEUMATOID FACTOR: Rheumatoid fact SerPl-aCnc: <14 IU/mL (ref <14)

## 2019-04-23 LAB — CYCLIC CITRUL PEPTIDE ANTIBODY, IGG: Cyclic Citrullin Peptide Ab: <16 UNITS

## 2020-04-27 ENCOUNTER — Other Ambulatory Visit: Payer: Self-pay

## 2020-04-27 ENCOUNTER — Encounter: Payer: Self-pay | Admitting: Family Medicine

## 2020-04-27 ENCOUNTER — Telehealth (INDEPENDENT_AMBULATORY_CARE_PROVIDER_SITE_OTHER): Payer: Self-pay | Admitting: Family Medicine

## 2020-04-27 DIAGNOSIS — J0100 Acute maxillary sinusitis, unspecified: Secondary | ICD-10-CM

## 2020-04-27 DIAGNOSIS — J019 Acute sinusitis, unspecified: Secondary | ICD-10-CM | POA: Insufficient documentation

## 2020-04-27 DIAGNOSIS — H6981 Other specified disorders of Eustachian tube, right ear: Secondary | ICD-10-CM

## 2020-04-27 DIAGNOSIS — H6980 Other specified disorders of Eustachian tube, unspecified ear: Secondary | ICD-10-CM | POA: Insufficient documentation

## 2020-04-27 MED ORDER — FLUTICASONE PROPIONATE 50 MCG/ACT NA SUSP: 2.0000 | Freq: Every day | NASAL | 6 refills | Status: AC

## 2020-04-27 MED ORDER — AMOXICILLIN-POT CLAVULANATE 875-125 MG PO TABS: 1.0000 | ORAL_TABLET | Freq: Two times a day (BID) | ORAL | 0 refills | Status: AC

## 2020-04-27 NOTE — Progress Notes (Signed)
Virtual Visit via Video Note  I connected with Brandy Zuniga on 04/27/20 at  4:00 PM EST by a video enabled telemedicine application and verified that I am speaking with the correct person using two identifiers.  Location: Patient: home Provider: office   I discussed the limitations of evaluation and management by telemedicine and the availability of in person appointments. The patient expressed understanding and agreed to proceed.  Parties involved in encounter  Patient: Brandy Zuniga   Provider:  Roxy Manns MD   Video failed today and visit was conducted by phone   History of Present Illness: Pt presents with sinus c/o  34 yo pt of [facility]  H/o bad allergies  May have had a head cold  Feels like now she has a sinus infection   On Wednesday her L ear seemed full/muffled  Thursday R ear- the same  Friday-could not hear out of R ear  No imp with otc drops   Had some discharge for 2 nights  Could hear for a few seconds at a time   A lot of pressure  Head/sinuses hurt also -cheek near the R ear  Congested with mucous-yellow /green  Some cough   No fever No aches No loss of taste and smell   Has not been tested for covid  Had covid last year    occ takes zyrtec during allergy season  No steroid nasal sprays recently  Used a netti pot years ago   No hx of ear wax problems     Patient Active Problem List   Diagnosis Date Noted  . Acute sinusitis 04/27/2020  . ETD (eustachian tube dysfunction) 04/27/2020  . Asthma 04/10/2019  . Myalgia 04/10/2019  . Abnormal glucose tolerance test (GTT) during pregnancy, antepartum 01/08/2018  . ANXIETY 07/29/2007  . ALLERGIC RHINITIS 07/29/2007   Past Medical History:  Diagnosis Date  . Allergic rhinitis   . Anxiety   . Asthma   . Asthma with acute exacerbation 07/29/2007   Qualifier: Diagnosis of  By: Amador Cunas  MD, Janett Labella   . COVID-19   . Gestational hypertension without significant proteinuria in third trimester  03/01/2018  . 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 History of nephrolithiasis   . 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 NEPHROLITHIASIS, HX OF 10/14/2008   Qualifier: Diagnosis of  By: Amador Cunas  MD, Janett Labella   . PTSD (post-traumatic stress disorder) possible 02/28/2013   with anxiety     Past Surgical History:  Procedure Laterality Date  . CESAREAN SECTION N/A 03/02/2018   Procedure: CESAREAN SECTION;  Surgeon: Marlow Baars, MD;  Location: [facility] BIRTHING SU[facility];  Service: Obstetrics;  Laterality: N/A;   Social History   Tobacco Use  . Smoking status: Former Smoker    Packs/day: 0.50  . Smokeless tobacco: Never Used  Vaping Use  . Vaping Use: Never used  Substance Use Topics  . Alcohol use: No  . Drug use: No   Family History  Problem Relation Age of Onset  . Hypertension Mother   . Anxiety disorder Mother   . Rheum arthritis Mother   . Hypertension Father   . Hyperlipidemia Father    No Known Allergies No current outpatient medications on file prior to visit.   No current facility-administered medications on file prior to  visit.     Review of Systems  Constitutional: Negative for chills, fever and malaise/fatigue.  HENT: Positive for congestion, ear pain, hearing loss and sinus pain. Negative for sore throat.   Eyes: Negative for blurred vision, discharge and redness.  Respiratory: Positive for cough and sputum production. Negative for shortness of breath and stridor.   Cardiovascular: Negative for chest pain, palpitations and leg swelling.  Gastrointestinal: Negative for abdominal pain, diarrhea, nausea and vomiting.  Musculoskeletal: Negative for myalgias.  Skin: Negative for rash.  Neurological: Positive for headaches. Negative for dizziness.    Observations/Objective: Pt sound well, not distressed Voice is mildly hoarse  No sob  or wheeze or cough heard  Nl cognition-good historian  Nl mood   Assessment and Plan: Problem List Items Addressed This Visit      Respiratory   Acute sinusitis - Primary    With R maxillary pain/pressure/purulent mucous and R ear pain  tx with augmentin and flonase ns Saline rinse prn  Consider covid testing  nsaid may help  Update if not starting to improve in a week or if worsening  Esp if severe ear pain or fever   Meds ordered this encounter  Medications  . amoxicillin-clavulanate (AUGMENTIN) 875-125 MG tablet    Sig: Take 1 tablet by mouth 2 (two) times daily.    Dispense:  14 tablet    Refill:  0  . fluticasone (FLONASE) 50 MCG/ACT nasal spray    Sig: Place 2 sprays into both nostrils daily.    Dispense:  16 g    Refill:  6         Relevant Medications   amoxicillin-clavulanate (AUGMENTIN) 875-125 MG tablet   fluticasone (FLONASE) 50 MCG/ACT nasal spray     Nervous and Auditory   ETD (eustachian tube dysfunction)    With sinusitis Px flonase to use daily  (also augmentin for sinus infection)  Update if not starting to improve in a week or if worsening   If no imp will need in person visit to assess TM and canal           Follow Up Instructions: Take augmentin as directed  Drink lots of fluids Use flonase daily-this may take a week or more to work  Ibuprofen over the counter may help discomfort also (take with food) Update if not starting to improve in a week or if worsening   Consider covid testing and call us with result also   I discussed the assessment and treatment plan with the patient. The patient was provided an opportunity to ask questions and all were answered. The patient agreed with the plan and demonstrated an understanding of the instructions.   The patient was advised to call back or seek an in-person evaluation if the symptoms worsen or if the condition fails to improve as anticipated.  I provided 18 minutes of non-face-to-face time  during this encounter.   Roxy Manns, MD

## 2020-04-27 NOTE — Patient Instructions (Signed)
Take augmentin as directed  Drink lots of fluids Use flonase daily-this may take a week or more to work  Ibuprofen over the counter may help discomfort also (take with food) Update if not starting to improve in a week or if worsening   Consider covid testing and call us with result also

## 2020-04-27 NOTE — Assessment & Plan Note (Signed)
With R maxillary pain/pressure/purulent mucous and R ear pain  tx with augmentin and flonase ns Saline rinse prn  Consider covid testing  nsaid may help  Update if not starting to improve in a week or if worsening  Esp if severe ear pain or fever   Meds ordered this encounter  Medications  . amoxicillin-clavulanate (AUGMENTIN) 875-125 MG tablet    Sig: Take 1 tablet by mouth 2 (two) times daily.    Dispense:  14 tablet    Refill:  0  . fluticasone (FLONASE) 50 MCG/ACT nasal spray    Sig: Place 2 sprays into both nostrils daily.    Dispense:  16 g    Refill:  6

## 2020-04-27 NOTE — Assessment & Plan Note (Signed)
With sinusitis Px flonase to use daily  (also augmentin for sinus infection)  Update if not starting to improve in a week or if worsening   If no imp will need in person visit to assess TM and canal
